# Patient Record
Sex: Female | Born: 1940 | Race: White | Hispanic: No | Marital: Married | Smoking: Former smoker
Health system: Southern US, Community
[De-identification: ages and names within clinical notes are randomized; demographics above are authoritative.]

## PROBLEM LIST (undated history)

## (undated) DIAGNOSIS — M1990 Unspecified osteoarthritis, unspecified site: Secondary | ICD-10-CM

## (undated) DIAGNOSIS — Z87440 Personal history of urinary (tract) infections: Secondary | ICD-10-CM

## (undated) DIAGNOSIS — K649 Unspecified hemorrhoids: Secondary | ICD-10-CM

## (undated) DIAGNOSIS — Z85828 Personal history of other malignant neoplasm of skin: Secondary | ICD-10-CM

## (undated) DIAGNOSIS — Z8619 Personal history of other infectious and parasitic diseases: Secondary | ICD-10-CM

## (undated) HISTORY — PX: URETHRAL STRICTURE DILATATION: SHX477

## (undated) HISTORY — PX: TUBAL LIGATION: SHX77

## (undated) HISTORY — PX: EYE SURGERY: SHX253

## (undated) HISTORY — PX: TONSILLECTOMY: SUR1361

---

## 1998-10-26 ENCOUNTER — Encounter: Admission: RE | Admit: 1998-10-26 | Discharge: 1998-10-26 | Payer: Self-pay | Admitting: Obstetrics and Gynecology

## 1999-11-02 ENCOUNTER — Encounter: Admission: RE | Admit: 1999-11-02 | Discharge: 1999-11-02 | Payer: Self-pay | Admitting: Obstetrics and Gynecology

## 1999-11-02 ENCOUNTER — Encounter: Payer: Self-pay | Admitting: Obstetrics and Gynecology

## 2000-11-01 ENCOUNTER — Encounter: Payer: Self-pay | Admitting: Obstetrics and Gynecology

## 2000-11-01 ENCOUNTER — Encounter: Admission: RE | Admit: 2000-11-01 | Discharge: 2000-11-01 | Payer: Self-pay | Admitting: Obstetrics and Gynecology

## 2001-11-12 ENCOUNTER — Encounter: Payer: Self-pay | Admitting: Obstetrics and Gynecology

## 2001-11-12 ENCOUNTER — Encounter: Admission: RE | Admit: 2001-11-12 | Discharge: 2001-11-12 | Payer: Self-pay | Admitting: Obstetrics and Gynecology

## 2002-11-19 ENCOUNTER — Encounter: Admission: RE | Admit: 2002-11-19 | Discharge: 2002-11-19 | Payer: Self-pay | Admitting: Obstetrics and Gynecology

## 2004-09-22 ENCOUNTER — Encounter: Admission: RE | Admit: 2004-09-22 | Discharge: 2004-09-22 | Payer: Self-pay | Admitting: Obstetrics and Gynecology

## 2005-10-17 ENCOUNTER — Encounter: Admission: RE | Admit: 2005-10-17 | Discharge: 2005-10-17 | Payer: Self-pay | Admitting: Obstetrics and Gynecology

## 2006-12-06 ENCOUNTER — Encounter: Admission: RE | Admit: 2006-12-06 | Discharge: 2006-12-06 | Payer: Self-pay | Admitting: Obstetrics and Gynecology

## 2007-12-10 ENCOUNTER — Encounter: Admission: RE | Admit: 2007-12-10 | Discharge: 2007-12-10 | Payer: Self-pay | Admitting: Obstetrics and Gynecology

## 2009-09-22 ENCOUNTER — Encounter: Admission: RE | Admit: 2009-09-22 | Discharge: 2009-09-22 | Payer: Self-pay | Admitting: Obstetrics and Gynecology

## 2011-02-22 DIAGNOSIS — D239 Other benign neoplasm of skin, unspecified: Secondary | ICD-10-CM | POA: Diagnosis not present

## 2011-02-22 DIAGNOSIS — C44519 Basal cell carcinoma of skin of other part of trunk: Secondary | ICD-10-CM | POA: Diagnosis not present

## 2011-02-22 DIAGNOSIS — L738 Other specified follicular disorders: Secondary | ICD-10-CM | POA: Diagnosis not present

## 2011-04-28 DIAGNOSIS — H35439 Paving stone degeneration of retina, unspecified eye: Secondary | ICD-10-CM | POA: Diagnosis not present

## 2011-04-28 DIAGNOSIS — H43819 Vitreous degeneration, unspecified eye: Secondary | ICD-10-CM | POA: Diagnosis not present

## 2011-04-28 DIAGNOSIS — Z961 Presence of intraocular lens: Secondary | ICD-10-CM | POA: Diagnosis not present

## 2011-04-28 DIAGNOSIS — H01009 Unspecified blepharitis unspecified eye, unspecified eyelid: Secondary | ICD-10-CM | POA: Diagnosis not present

## 2012-04-30 DIAGNOSIS — H52209 Unspecified astigmatism, unspecified eye: Secondary | ICD-10-CM | POA: Diagnosis not present

## 2012-04-30 DIAGNOSIS — Z961 Presence of intraocular lens: Secondary | ICD-10-CM | POA: Diagnosis not present

## 2012-04-30 DIAGNOSIS — H04129 Dry eye syndrome of unspecified lacrimal gland: Secondary | ICD-10-CM | POA: Diagnosis not present

## 2012-04-30 DIAGNOSIS — H43819 Vitreous degeneration, unspecified eye: Secondary | ICD-10-CM | POA: Diagnosis not present

## 2012-11-20 DIAGNOSIS — L821 Other seborrheic keratosis: Secondary | ICD-10-CM | POA: Diagnosis not present

## 2012-11-20 DIAGNOSIS — L570 Actinic keratosis: Secondary | ICD-10-CM | POA: Diagnosis not present

## 2012-11-20 DIAGNOSIS — D485 Neoplasm of uncertain behavior of skin: Secondary | ICD-10-CM | POA: Diagnosis not present

## 2012-11-20 DIAGNOSIS — L739 Follicular disorder, unspecified: Secondary | ICD-10-CM | POA: Diagnosis not present

## 2012-11-20 DIAGNOSIS — D235 Other benign neoplasm of skin of trunk: Secondary | ICD-10-CM | POA: Diagnosis not present

## 2012-11-20 DIAGNOSIS — Z85828 Personal history of other malignant neoplasm of skin: Secondary | ICD-10-CM | POA: Diagnosis not present

## 2013-02-06 ENCOUNTER — Other Ambulatory Visit: Payer: Self-pay

## 2013-02-06 DIAGNOSIS — Z1231 Encounter for screening mammogram for malignant neoplasm of breast: Secondary | ICD-10-CM

## 2013-02-25 ENCOUNTER — Ambulatory Visit: Payer: Self-pay

## 2013-02-26 ENCOUNTER — Ambulatory Visit
Admission: RE | Admit: 2013-02-26 | Discharge: 2013-02-26 | Disposition: A | Discharge status: Home / Self Care | Payer: Medicare Other | Source: Ambulatory Visit

## 2013-02-26 DIAGNOSIS — Z1231 Encounter for screening mammogram for malignant neoplasm of breast: Secondary | ICD-10-CM

## 2013-05-06 DIAGNOSIS — Z780 Asymptomatic menopausal state: Secondary | ICD-10-CM | POA: Diagnosis not present

## 2013-05-06 DIAGNOSIS — Z23 Encounter for immunization: Secondary | ICD-10-CM | POA: Diagnosis not present

## 2013-05-06 DIAGNOSIS — Z136 Encounter for screening for cardiovascular disorders: Secondary | ICD-10-CM | POA: Diagnosis not present

## 2013-05-06 DIAGNOSIS — Z0000 Encounter for general adult medical examination without abnormal findings: Secondary | ICD-10-CM | POA: Diagnosis not present

## 2013-05-07 ENCOUNTER — Other Ambulatory Visit: Payer: Self-pay | Admitting: Family Medicine

## 2013-05-07 DIAGNOSIS — E2839 Other primary ovarian failure: Secondary | ICD-10-CM

## 2013-05-13 DIAGNOSIS — H52209 Unspecified astigmatism, unspecified eye: Secondary | ICD-10-CM | POA: Diagnosis not present

## 2013-05-13 DIAGNOSIS — H524 Presbyopia: Secondary | ICD-10-CM | POA: Diagnosis not present

## 2013-05-13 DIAGNOSIS — H43819 Vitreous degeneration, unspecified eye: Secondary | ICD-10-CM | POA: Diagnosis not present

## 2013-05-13 DIAGNOSIS — Z961 Presence of intraocular lens: Secondary | ICD-10-CM | POA: Diagnosis not present

## 2013-05-14 ENCOUNTER — Ambulatory Visit
Admission: RE | Admit: 2013-05-14 | Discharge: 2013-05-14 | Disposition: A | Discharge status: Home / Self Care | Payer: Medicare Other | Source: Ambulatory Visit | Attending: Family Medicine | Admitting: Family Medicine

## 2013-05-14 DIAGNOSIS — M949 Disorder of cartilage, unspecified: Secondary | ICD-10-CM | POA: Diagnosis not present

## 2013-05-14 DIAGNOSIS — M899 Disorder of bone, unspecified: Secondary | ICD-10-CM | POA: Diagnosis not present

## 2013-05-14 DIAGNOSIS — E2839 Other primary ovarian failure: Secondary | ICD-10-CM

## 2013-05-16 DIAGNOSIS — Z1211 Encounter for screening for malignant neoplasm of colon: Secondary | ICD-10-CM | POA: Diagnosis not present

## 2013-07-11 DIAGNOSIS — M21069 Valgus deformity, not elsewhere classified, unspecified knee: Secondary | ICD-10-CM | POA: Diagnosis not present

## 2013-07-11 DIAGNOSIS — M1710 Unilateral primary osteoarthritis, unspecified knee: Secondary | ICD-10-CM | POA: Diagnosis not present

## 2013-07-16 DIAGNOSIS — R300 Dysuria: Secondary | ICD-10-CM | POA: Diagnosis not present

## 2013-10-10 DIAGNOSIS — IMO0002 Reserved for concepts with insufficient information to code with codable children: Secondary | ICD-10-CM | POA: Diagnosis not present

## 2013-10-10 DIAGNOSIS — M1710 Unilateral primary osteoarthritis, unspecified knee: Secondary | ICD-10-CM | POA: Diagnosis not present

## 2013-10-22 DIAGNOSIS — Z01810 Encounter for preprocedural cardiovascular examination: Secondary | ICD-10-CM | POA: Diagnosis not present

## 2013-10-22 DIAGNOSIS — M179 Osteoarthritis of knee, unspecified: Secondary | ICD-10-CM | POA: Diagnosis not present

## 2013-10-22 DIAGNOSIS — Z23 Encounter for immunization: Secondary | ICD-10-CM | POA: Diagnosis not present

## 2013-10-31 ENCOUNTER — Other Ambulatory Visit: Payer: Self-pay | Admitting: Orthopedic Surgery

## 2013-10-31 NOTE — Progress Notes (Signed)
Preoperative surgical orders have been place into the Epic hospital system for Jamie Galloway on 10/31/2013, 12:58 PM  by Mickel Crow for surgery on 11/18/2013.  Preop Total Knee orders including Experal, IV Tylenol, and IV Decadron as long as there are no contraindications to the above medications. Arlee Muslim, PA-C

## 2013-11-06 ENCOUNTER — Encounter (HOSPITAL_COMMUNITY): Payer: Self-pay | Admitting: Pharmacy Technician

## 2013-11-07 ENCOUNTER — Encounter (HOSPITAL_COMMUNITY)
Admission: RE | Admit: 2013-11-07 | Discharge: 2013-11-07 | Disposition: A | Discharge status: Home / Self Care | Payer: Medicare Other | Source: Ambulatory Visit | Attending: Orthopedic Surgery | Admitting: Orthopedic Surgery

## 2013-11-07 ENCOUNTER — Ambulatory Visit (HOSPITAL_COMMUNITY)
Admission: RE | Admit: 2013-11-07 | Discharge: 2013-11-07 | Disposition: A | Discharge status: Home / Self Care | Payer: Medicare Other | Source: Ambulatory Visit | Attending: Anesthesiology | Admitting: Anesthesiology

## 2013-11-07 ENCOUNTER — Encounter (HOSPITAL_COMMUNITY): Payer: Self-pay

## 2013-11-07 DIAGNOSIS — Z01818 Encounter for other preprocedural examination: Secondary | ICD-10-CM | POA: Diagnosis not present

## 2013-11-07 DIAGNOSIS — M179 Osteoarthritis of knee, unspecified: Secondary | ICD-10-CM | POA: Insufficient documentation

## 2013-11-07 DIAGNOSIS — R05 Cough: Secondary | ICD-10-CM | POA: Insufficient documentation

## 2013-11-07 DIAGNOSIS — R059 Cough, unspecified: Secondary | ICD-10-CM

## 2013-11-07 HISTORY — DX: Unspecified hemorrhoids: K64.9

## 2013-11-07 HISTORY — DX: Personal history of other infectious and parasitic diseases: Z86.19

## 2013-11-07 HISTORY — DX: Unspecified osteoarthritis, unspecified site: M19.90

## 2013-11-07 LAB — COMPREHENSIVE METABOLIC PANEL
ALBUMIN: 3.8 g/dL (ref 3.5–5.2)
ALT: 23 U/L (ref 0–35)
ANION GAP: 10 (ref 5–15)
AST: 27 U/L (ref 0–37)
Alkaline Phosphatase: 64 U/L (ref 39–117)
BUN: 18 mg/dL (ref 6–23)
CALCIUM: 9.4 mg/dL (ref 8.4–10.5)
CO2: 26 mEq/L (ref 19–32)
Chloride: 100 mEq/L (ref 96–112)
Creatinine, Ser: 0.72 mg/dL (ref 0.50–1.10)
GFR calc Af Amer: >90 mL/min (ref >90)
GFR calc non Af Amer: 83 mL/min — ABNORMAL LOW (ref >90)
GLUCOSE: 104 mg/dL — AB (ref 70–99)
POTASSIUM: 5 meq/L (ref 3.7–5.3)
SODIUM: 136 meq/L — AB (ref 137–147)
TOTAL PROTEIN: 6.5 g/dL (ref 6.0–8.3)
Total Bilirubin: 1.5 mg/dL — ABNORMAL HIGH (ref 0.3–1.2)

## 2013-11-07 LAB — URINALYSIS, ROUTINE W REFLEX MICROSCOPIC
Bilirubin Urine: NEGATIVE
Glucose, UA: NEGATIVE mg/dL
Hgb urine dipstick: NEGATIVE
KETONES UR: NEGATIVE mg/dL
LEUKOCYTES UA: NEGATIVE
Nitrite: NEGATIVE
PH: 6 (ref 5.0–8.0)
Protein, ur: NEGATIVE mg/dL
Specific Gravity, Urine: 1.017 (ref 1.005–1.030)
Urobilinogen, UA: 0.2 mg/dL (ref 0.0–1.0)

## 2013-11-07 LAB — CBC
HCT: 42.5 % (ref 36.0–46.0)
HEMOGLOBIN: 15.1 g/dL — AB (ref 12.0–15.0)
MCH: 31.6 pg (ref 26.0–34.0)
MCHC: 35.5 g/dL (ref 30.0–36.0)
MCV: 88.9 fL (ref 78.0–100.0)
Platelets: 283 10*3/uL (ref 150–400)
RBC: 4.78 MIL/uL (ref 3.87–5.11)
RDW: 12.7 % (ref 11.5–15.5)
WBC: 8.1 10*3/uL (ref 4.0–10.5)

## 2013-11-07 LAB — SURGICAL PCR SCREEN
MRSA, PCR: NEGATIVE
STAPHYLOCOCCUS AUREUS: NEGATIVE

## 2013-11-07 LAB — PROTIME-INR
INR: 1.06 (ref 0.00–1.49)
Prothrombin Time: 13.9 seconds (ref 11.6–15.2)

## 2013-11-07 LAB — APTT: APTT: 26 s (ref 24–37)

## 2013-11-07 NOTE — Patient Instructions (Signed)
YOUR SURGERY IS SCHEDULED AT [facility]  ON:  Monday  11/2  REPORT TO  SHORT STAY CENTER AT:  6:30 AM   DO NOT EAT OR DRINK ANYTHING AFTER MIDNIGHT THE NIGHT BEFORE YOUR SURGERY.  YOU MAY BRUSH YOUR TEETH, RINSE OUT YOUR MOUTH--BUT NO WATER, NO FOOD, NO CHEWING GUM, NO MINTS, NO CANDIES, NO CHEWING TOBACCO.  PLEASE TAKE THE FOLLOWING MEDICATIONS THE AM OF YOUR SURGERY WITH A FEW SIPS OF WATER:  NO MEDICATIONS TO TAKE    DO NOT BRING VALUABLES, MONEY, CREDIT CARDS.  DO NOT WEAR JEWELRY, MAKE-UP, NAIL POLISH AND NO METAL PINS OR CLIPS IN YOUR HAIR. CONTACT LENS, DENTURES / PARTIALS, GLASSES SHOULD NOT BE WORN TO SURGERY AND IN MOST CASES-HEARING AIDS WILL NEED TO BE REMOVED.  BRING YOUR GLASSES CASE, ANY EQUIPMENT NEEDED FOR YOUR CONTACT LENS. FOR PATIENTS ADMITTED TO THE HOSPITAL--CHECK OUT TIME THE DAY OF DISCHARGE IS 11:00 AM.  ALL INPATIENT ROOMS ARE PRIVATE - WITH BATHROOM, TELEPHONE, TELEVISION AND WIFI INTERNET.    PLEASE BE AWARE THAT YOU MAY NEED ADDITIONAL BLOOD DRAWN DAY OF YOUR SURGERY  _______________________________________________________________________   [facility] - Preparing for Surgery Before surgery, you can play an important role.  Because skin is not sterile, your skin needs to be as free of germs as possible.  You can reduce the number of germs on your skin by washing with CHG (chlorahexidine gluconate) soap before surgery.  CHG is an antiseptic cleaner which kills germs and bonds with the skin to continue killing germs even after washing. Please DO NOT use if you have an allergy to CHG or antibacterial soaps.  If your skin becomes reddened/irritated stop using the CHG and inform your nurse when you arrive at Short Stay. Do not shave (including legs and underarms) for at least 48 hours prior to the first CHG shower.  You may shave your face/neck. Please follow these instructions carefully:  1.  Shower with CHG Soap the night before surgery and the   morning of Surgery.  2.  If you choose to wash your hair, wash your hair first as usual with your  normal  shampoo.  3.  After you shampoo, rinse your hair and body thoroughly to remove the  shampoo.                           4.  Use CHG as you would any other liquid soap.  You can apply chg directly  to the skin and wash                       Gently with a scrungie or clean washcloth.  5.  Apply the CHG Soap to your body ONLY FROM THE NECK DOWN.   Do not use on face/ open                           Wound or open sores. Avoid contact with eyes, ears mouth and genitals (private parts).                       Wash face,  Genitals (private parts) with your normal soap.             6.  Wash thoroughly, paying special attention to the area where your surgery  will be performed.  7.  Thoroughly rinse your body with  warm water from the neck down.  8.  DO NOT shower/wash with your normal soap after using and rinsing off  the CHG Soap.                9.  Pat yourself dry with a clean towel.            10.  Wear clean pajamas.            11.  Place clean sheets on your bed the night of your first shower and do not  sleep with pets. Day of Surgery : Do not apply any lotions/deodorants the morning of surgery.  Please wear clean clothes to the hospital/surgery center.  FAILURE TO FOLLOW THESE INSTRUCTIONS MAY RESULT IN THE CANCELLATION OF YOUR SURGERY PATIENT SIGNATURE_________________________________  NURSE SIGNATURE__________________________________  ________________________________________________________________________   Jamie Galloway  An incentive spirometer is a tool that can help keep your lungs clear and active. This tool measures how well you are filling your lungs with each breath. Taking long deep breaths may help reverse or decrease the chance of developing breathing (pulmonary) problems (especially infection) following:  A long period of time when you are unable to move or be  active. BEFORE THE PROCEDURE   If the spirometer includes an indicator to show your best effort, your nurse or respiratory therapist will set it to a desired goal.  If possible, sit up straight or lean slightly forward. Try not to slouch.  Hold the incentive spirometer in an upright position. INSTRUCTIONS FOR USE  1. Sit on the edge of your bed if possible, or sit up as far as you can in bed or on a chair. 2. Hold the incentive spirometer in an upright position. 3. Breathe out normally. 4. Place the mouthpiece in your mouth and seal your lips tightly around it. 5. Breathe in slowly and as deeply as possible, raising the piston or the ball toward the top of the column. 6. Hold your breath for 3-5 seconds or for as long as possible. Allow the piston or ball to fall to the bottom of the column. 7. Remove the mouthpiece from your mouth and breathe out normally. 8. Rest for a few seconds and repeat Steps 1 through 7 at least 10 times every 1-2 hours when you are awake. Take your time and take a few normal breaths between deep breaths. 9. The spirometer may include an indicator to show your best effort. Use the indicator as a goal to work toward during each repetition. 10. After each set of 10 deep breaths, practice coughing to be sure your lungs are clear. If you have an incision (the cut made at the time of surgery), support your incision when coughing by placing a pillow or rolled up towels firmly against it. Once you are able to get out of bed, walk around indoors and cough well. You may stop using the incentive spirometer when instructed by your caregiver.  RISKS AND COMPLICATIONS  Take your time so you do not get dizzy or light-headed.  If you are in pain, you may need to take or ask for pain medication before doing incentive spirometry. It is harder to take a deep breath if you are having pain. AFTER USE  Rest and breathe slowly and easily.  It can be helpful to keep track of a log of  your progress. Your caregiver can provide you with a simple table to help with this. If you are using the spirometer at home, follow these  instructions: SEEK MEDICAL CARE IF:   You are having difficultly using the spirometer.  You have trouble using the spirometer as often as instructed.  Your pain medication is not giving enough relief while using the spirometer.  You develop fever of 100.5 F (38.1 C) or higher. SEEK IMMEDIATE MEDICAL CARE IF:   You cough up bloody sputum that had not been present before.  You develop fever of 102 F (38.9 C) or greater.  You develop worsening pain at or near the incision site. MAKE SURE YOU:   Understand these instructions.  Will watch your condition.  Will get help right away if you are not doing well or get worse. Document Released: 05/16/2006 Document Revised: 03/28/2011 Document Reviewed: 07/17/2006 Exit[employer] Patient Information 2014 [employer], [place].   ________________________________________________________________________  WHAT IS A BLOOD TRANSFUSION? Blood Transfusion Information  A transfusion is the replacement of blood or some of its parts. Blood is made up of multiple cells which provide different functions.  Red blood cells carry oxygen and are used for blood loss replacement.  White blood cells fight against infection.  Platelets control bleeding.  Plasma helps clot blood.  Other blood products are available for specialized needs, such as hemophilia or other clotting disorders. BEFORE THE TRANSFUSION  Who gives blood for transfusions?   Healthy volunteers who are fully evaluated to make sure their blood is safe. This is blood bank blood. Transfusion therapy is the safest it has ever been in the practice of medicine. Before blood is taken from a donor, a complete history is taken to make sure that person has no history of diseases nor engages in risky social behavior (examples are intravenous drug use or sexual activity  with multiple partners). The donor's travel history is screened to minimize risk of transmitting infections, such as malaria. The donated blood is tested for signs of infectious diseases, such as HIV and hepatitis. The blood is then tested to be sure it is compatible with you in order to minimize the chance of a transfusion reaction. If you or a relative donates blood, this is often done in anticipation of surgery and is not appropriate for emergency situations. It takes many days to process the donated blood. RISKS AND COMPLICATIONS Although transfusion therapy is very safe and saves many lives, the main dangers of transfusion include:   Getting an infectious disease.  Developing a transfusion reaction. This is an allergic reaction to something in the blood you were given. Every precaution is taken to prevent this. The decision to have a blood transfusion has been considered carefully by your caregiver before blood is given. Blood is not given unless the benefits outweigh the risks. AFTER THE TRANSFUSION  Right after receiving a blood transfusion, you will usually feel much better and more energetic. This is especially true if your red blood cells have gotten low (anemic). The transfusion raises the level of the red blood cells which carry oxygen, and this usually causes an energy increase.  The nurse administering the transfusion will monitor you carefully for complications. HOME CARE INSTRUCTIONS  No special instructions are needed after a transfusion. You may find your energy is better. Speak with your caregiver about any limitations on activity for underlying diseases you may have. SEEK MEDICAL CARE IF:   Your condition is not improving after your transfusion.  You develop redness or irritation at the intravenous (IV) site. SEEK IMMEDIATE MEDICAL CARE IF:  Any of the following symptoms occur over the next 12 hours:  Shaking  chills.  You have a temperature by mouth above 102 F (38.9  C), not controlled by medicine.  Chest, back, or muscle pain.  People around you feel you are not acting correctly or are confused.  Shortness of breath or difficulty breathing.  Dizziness and fainting.  You get a rash or develop hives.  You have a decrease in urine output.  Your urine turns a dark color or changes to pink, red, or brown. Any of the following symptoms occur over the next 10 days:  You have a temperature by mouth above 102 F (38.9 C), not controlled by medicine.  Shortness of breath.  Weakness after normal activity.  The white part of the eye turns yellow (jaundice).  You have a decrease in the amount of urine or are urinating less often.  Your urine turns a dark color or changes to pink, red, or brown. Document Released: 01/01/2000 Document Revised: 03/28/2011 Document Reviewed: 08/20/2007 [facility] Patient Information 2014 [place], [place].  _______________________________________________________________________

## 2013-11-07 NOTE — Pre-Procedure Instructions (Signed)
EKG REPORT ON PT'S CHART FROM 10/22/13 DR. RANKINS AND NOTE OF MEDICAL CLEARANCE FOR SURGERY. CXR WAS DONE TODAY PREOP AT [facility].

## 2013-11-17 ENCOUNTER — Ambulatory Visit: Payer: Self-pay | Admitting: Orthopedic Surgery

## 2013-11-17 NOTE — H&P (Signed)
Jamie Galloway DOB: 05/15/1940 Married / Language: English / Race: White Female Date of Admission:  11-18-2013 Chief Complaint:  Right knee pain History of Present Illness  The patient is a 73 year old female who comes in for a preoperative History and Physical. The patient is scheduled for a right total knee arthroplasty to be performed by Dr. Dione Plover. Aluisio, MD at [facility] on 11/18/2013. The patient is a 73 year old female who presents with knee complaints. The patient reports right knee symptoms including: pain which began year(s) ago without any known injury. The patient describes the severity of the symptoms as moderate in severity. The patient describes their pain as dull.The patient feels that the symptoms are unchanging. and include knee pain, swelling, stiffness, decreased range of motion and difficulty bearing weight. Symptoms are exacerbated by motion at the knee and weight bearing. Current treatment includes nonsteroidal anti-inflammatory drugs. Jamie Galloway comes in from referral from friends to have her right knee evaluated. The knee has been bothering her for many years and has gotten progressively worse with time. She states that "I turn my knee in" which has made it more difficult to stand for any length of time. Shes denies any numbness or tingling in that leg but gets some numbness in the opposite foot if stands too long and compensates of the right knee. She denies a lot of popping and catching and it has only buckled once or twice in the past. Her biggerst complaints or stiffness and pain. She played tennis for about 35 years but stopped about 8 years ago. She denies any specific injury to the leg. She has lost motion with the knee stating its more difficult to do her foot care. Its not bad first thing in the morning whenshe gets up but starts to hurt after a few steps. She sleeps okay by elevating both knees on pillows for support. She has tried creams with no  benefit, braces seem to make if hurt worse, and arch supports dont work. She will take oral Advil with some benefit. We have discussed injections but at this point, she wants to go ahead and get a more permanent fix to the knee. She is ready to proceed with the knee replacement. They have been treated conservatively in the past for the above stated problem and despite conservative measures, they continue to have progressive pain and severe functional limitations and dysfunction. They have failed non-operative management including home exercise, medications. It is felt that they would benefit from undergoing total joint replacement. Risks and benefits of the procedure have been discussed with the patient and they elect to proceed with surgery. There are no active contraindications to surgery such as ongoing infection or rapidly progressive neurological disease.   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 Osteoarthritis Mother, Paternal Grandmother. Osteoporosis Mother.  Social History Not under pain contract Number of flights of stairs before winded 2-3 No history of drug/alcohol rehab Living situation live with spouse Marital status married Tobacco / smoke exposure 07/05/2013: no Tobacco use Former smoker. 07/05/2013: smoke(d) less than 1/2 pack(s) per day Exercise Exercises daily; does running / walking and other Current drinker 07/05/2013: Currently drinks wine 5-7 times per week Current work status retired Children 2 Post-Surgical Plans Home with her sister  as a caregiver Advance Directives Living Will  Medication History  Tumeric Active. Fish Oil Active. Vitamins/Minerals (Oral)  Active. Advil (Oral) Specific dose unknown - Active.   Past Surgical History  Tubal Ligation Cataract Surgery bilateral Tonsillectomy   Review of Systems General Not Present- Chills, Fatigue, Fever, Memory Loss, Night Sweats, Weight Gain and Weight Loss. Skin Not Present- Eczema, Hives, Itching, Lesions and Rash. HEENT Not Present- Dentures, Double Vision, Headache, Hearing Loss, Tinnitus and Visual Loss. Respiratory Not Present- Allergies, Chronic Cough, Coughing up blood, Shortness of breath at rest and Shortness of breath with exertion. Cardiovascular Not Present- Chest Pain, Difficulty Breathing Lying Down, Murmur, Palpitations, Racing/skipping heartbeats and Swelling. Gastrointestinal Not Present- Abdominal Pain, Bloody Stool, Constipation, Diarrhea, Difficulty Swallowing, Heartburn, Jaundice, Loss of appetitie, Nausea and Vomiting. Female Genitourinary Not Present- Blood in Urine, Discharge, Flank Pain, Incontinence, Painful Urination, Urgency, Urinary frequency, Urinary Retention, Urinating at Night and Weak urinary stream. Musculoskeletal Present- Joint Pain. Not Present- Back Pain, Joint Swelling, Morning Stiffness, Muscle Pain, Muscle Weakness and Spasms. Neurological Not Present- Blackout spells, Difficulty with balance, Dizziness, Paralysis, Tremor and Weakness. Psychiatric Not Present- Insomnia.  Vitals  Pulse: 72 (Regular)  Resp.: 14 (Unlabored)  BP: 118/88 (Sitting, Left Arm, Standard)   Physical Exam General Mental Status -Alert, cooperative and good historian. General Appearance-pleasant, Not in acute distress. Orientation-Oriented X3. Build & Nutrition-Well nourished and Well developed.  Head and Neck Head-normocephalic, atraumatic . Neck Global Assessment - supple, no bruit auscultated on the right, no bruit auscultated on the left.  Eye Pupil - Bilateral-Regular and Round. Motion - Bilateral-EOMI.  Chest and Lung  Exam Auscultation Breath sounds - clear at anterior chest wall and clear at posterior chest wall. Adventitious sounds - No Adventitious sounds.  Cardiovascular Auscultation Rhythm - Regular rate and rhythm. Heart Sounds - S1 WNL and S2 WNL. Murmurs & Other Heart Sounds - Auscultation of the heart reveals - No Murmurs.  Abdomen Palpation/Percussion Tenderness - Abdomen is non-tender to palpation. Rigidity (guarding) - Abdomen is soft. Auscultation Auscultation of the abdomen reveals - Bowel sounds normal.  Female Genitourinary Note: Not done, not pertinent to present illness   Musculoskeletal Note: The physical exam findings are as follows:   Peripheral Vascular Lower Extremity: Palpation:Pulses- Right- pulses intact.   Neurologic Sensation:Lower Extremity- Right- sensation is intact to light touch in the lower extremity.   Musculoskeletal Lower Extremity Right Lower Extremity: Right Hip: ROM:- Full ROM of the hip. Right Knee: Inspection and Palpation:Tenderness- lateral joint line tender to palpation. no tenderness to palpation of the medial joint line. Effusion- none. Crepitus- moderate and severe. Pulses- 2+. Sensation- normal. Other characteristics- no ecchymosis and no erythema. Alignment:Tibiofemoral alignment- valgus alignment (about a 17 degree Valgus malalingment deformity noted). ROM: Flexion:AROM- 11 . PROM- 120 . Extension:AROM- 5 . PROM- 5 . Tender medial and lateral with slight varus and valgus laxity.  RADIOGRAPHS: AP of both knees and lateral show bone on bone arthritis, lateral and patellofemoral compartments of the right knee with significant medial narrowing also. The left knee is unremarkable.   Assessment & Plan  Osteoarthritis of right knee (M17.9) Note:Plan is for a Right Total Knee Replacement by Dr. Wynelle Link.  Plan is to go home with her sister.  PCP - Dr. Milagros Evener - Patient has been seen  preoperatively and felt to be stable for surgery.  The patient does not have any contraindications and will receive TXA (tranexamic acid) prior to surgery.  Signed electronically by Joelene Millin, III  PA-C

## 2013-11-18 ENCOUNTER — Inpatient Hospital Stay (HOSPITAL_COMMUNITY): Payer: Medicare Other | Admitting: Anesthesiology

## 2013-11-18 ENCOUNTER — Encounter (HOSPITAL_COMMUNITY): Payer: Self-pay | Admitting: *Deleted

## 2013-11-18 ENCOUNTER — Inpatient Hospital Stay (HOSPITAL_COMMUNITY)
Admission: RE | Admit: 2013-11-18 | Discharge: 2013-11-20 | DRG: 470 | Disposition: A | Discharge status: Home Health | Payer: Medicare Other | Source: Ambulatory Visit | Attending: Orthopedic Surgery | Admitting: Orthopedic Surgery

## 2013-11-18 ENCOUNTER — Encounter (HOSPITAL_COMMUNITY)
Admission: RE | Disposition: A | Discharge status: Home Health | Payer: Self-pay | Source: Ambulatory Visit | Attending: Orthopedic Surgery

## 2013-11-18 DIAGNOSIS — M1711 Unilateral primary osteoarthritis, right knee: Principal | ICD-10-CM | POA: Diagnosis present

## 2013-11-18 DIAGNOSIS — Z6830 Body mass index (BMI) 30.0-30.9, adult: Secondary | ICD-10-CM

## 2013-11-18 DIAGNOSIS — M1710 Unilateral primary osteoarthritis, unspecified knee: Secondary | ICD-10-CM | POA: Diagnosis present

## 2013-11-18 DIAGNOSIS — M25561 Pain in right knee: Secondary | ICD-10-CM | POA: Diagnosis not present

## 2013-11-18 DIAGNOSIS — M179 Osteoarthritis of knee, unspecified: Secondary | ICD-10-CM | POA: Diagnosis not present

## 2013-11-18 HISTORY — PX: TOTAL KNEE ARTHROPLASTY: SHX125

## 2013-11-18 LAB — ABO/RH: ABO/RH(D): A POS

## 2013-11-18 LAB — TYPE AND SCREEN
ABO/RH(D): A POS
Antibody Screen: NEGATIVE

## 2013-11-18 SURGERY — ARTHROPLASTY, KNEE, TOTAL
Anesthesia: Monitor Anesthesia Care | Site: Knee | Laterality: Right

## 2013-11-18 MED ORDER — BUPIVACAINE HCL 0.25 % IJ SOLN
INTRAMUSCULAR | Status: DC | PRN
  Administered 2013-11-18: 20 mL

## 2013-11-18 MED ORDER — BUPIVACAINE LIPOSOME 1.3 % IJ SUSP
INTRAMUSCULAR | Status: DC | PRN
  Administered 2013-11-18: 20 mL

## 2013-11-18 MED ORDER — MIDAZOLAM HCL 2 MG/2ML IJ SOLN
INTRAMUSCULAR | Status: AC
  Filled 2013-11-18: qty 2

## 2013-11-18 MED ORDER — BISACODYL 10 MG RE SUPP: 10.0000 mg | Freq: Every day | RECTAL | Status: DC | PRN

## 2013-11-18 MED ORDER — ONDANSETRON HCL 4 MG PO TABS: 4.0000 mg | ORAL_TABLET | Freq: Four times a day (QID) | ORAL | Status: DC | PRN

## 2013-11-18 MED ORDER — ONDANSETRON HCL 4 MG/2ML IJ SOLN
INTRAMUSCULAR | Status: DC | PRN
  Administered 2013-11-18: 4 mg via INTRAVENOUS

## 2013-11-18 MED ORDER — ACETAMINOPHEN 325 MG PO TABS: 650.0000 mg | ORAL_TABLET | Freq: Four times a day (QID) | ORAL | Status: DC | PRN

## 2013-11-18 MED ORDER — PROPOFOL INFUSION 10 MG/ML OPTIME
INTRAVENOUS | Status: DC | PRN
  Administered 2013-11-18: 75 ug/kg/min via INTRAVENOUS

## 2013-11-18 MED ORDER — DEXAMETHASONE SODIUM PHOSPHATE 10 MG/ML IJ SOLN
10.0000 mg | Freq: Once | INTRAMUSCULAR | Status: AC
  Administered 2013-11-18: 10 mg via INTRAVENOUS

## 2013-11-18 MED ORDER — ONDANSETRON HCL 4 MG/2ML IJ SOLN: 4.0000 mg | Freq: Four times a day (QID) | INTRAMUSCULAR | Status: DC | PRN

## 2013-11-18 MED ORDER — DEXAMETHASONE SODIUM PHOSPHATE 10 MG/ML IJ SOLN
INTRAMUSCULAR | Status: AC
  Filled 2013-11-18: qty 1

## 2013-11-18 MED ORDER — ACETAMINOPHEN 10 MG/ML IV SOLN
1000.0000 mg | Freq: Once | INTRAVENOUS | Status: AC
  Administered 2013-11-18: 1000 mg via INTRAVENOUS
  Filled 2013-11-18: qty 100

## 2013-11-18 MED ORDER — CEFAZOLIN SODIUM-DEXTROSE 2-3 GM-% IV SOLR
2.0000 g | INTRAVENOUS | Status: AC
  Administered 2013-11-18: 2 g via INTRAVENOUS

## 2013-11-18 MED ORDER — ACETAMINOPHEN 500 MG PO TABS
1000.0000 mg | ORAL_TABLET | Freq: Four times a day (QID) | ORAL | Status: AC
  Administered 2013-11-18 – 2013-11-19 (×3): 1000 mg via ORAL
  Filled 2013-11-18 (×3): qty 2

## 2013-11-18 MED ORDER — PROPOFOL 10 MG/ML IV BOLUS
INTRAVENOUS | Status: AC
  Filled 2013-11-18: qty 20

## 2013-11-18 MED ORDER — METOCLOPRAMIDE HCL 10 MG PO TABS: 5.0000 mg | ORAL_TABLET | Freq: Three times a day (TID) | ORAL | Status: DC | PRN

## 2013-11-18 MED ORDER — BUPIVACAINE HCL (PF) 0.75 % IJ SOLN
INTRAMUSCULAR | Status: DC | PRN
  Administered 2013-11-18: 12 mg via INTRATHECAL

## 2013-11-18 MED ORDER — DOCUSATE SODIUM 100 MG PO CAPS
100.0000 mg | ORAL_CAPSULE | Freq: Two times a day (BID) | ORAL | Status: DC
  Administered 2013-11-18 – 2013-11-20 (×4): 100 mg via ORAL

## 2013-11-18 MED ORDER — DEXAMETHASONE SODIUM PHOSPHATE 10 MG/ML IJ SOLN
10.0000 mg | Freq: Once | INTRAMUSCULAR | Status: AC
  Administered 2013-11-19: 10 mg via INTRAVENOUS
  Filled 2013-11-18: qty 1

## 2013-11-18 MED ORDER — MORPHINE SULFATE 2 MG/ML IJ SOLN
1.0000 mg | INTRAMUSCULAR | Status: DC | PRN
  Administered 2013-11-18: 2 mg via INTRAVENOUS
  Filled 2013-11-18: qty 1

## 2013-11-18 MED ORDER — METHOCARBAMOL 500 MG PO TABS
500.0000 mg | ORAL_TABLET | Freq: Four times a day (QID) | ORAL | Status: DC | PRN
  Administered 2013-11-18 – 2013-11-19 (×3): 500 mg via ORAL
  Filled 2013-11-18 (×3): qty 1

## 2013-11-18 MED ORDER — KETOROLAC TROMETHAMINE 15 MG/ML IJ SOLN: 7.5000 mg | Freq: Four times a day (QID) | INTRAMUSCULAR | Status: AC | PRN

## 2013-11-18 MED ORDER — BUPIVACAINE HCL (PF) 0.25 % IJ SOLN
INTRAMUSCULAR | Status: AC
  Filled 2013-11-18: qty 30

## 2013-11-18 MED ORDER — SODIUM CHLORIDE 0.9 % IJ SOLN
INTRAMUSCULAR | Status: DC | PRN
  Administered 2013-11-18: 30 mL

## 2013-11-18 MED ORDER — TRANEXAMIC ACID 100 MG/ML IV SOLN
1000.0000 mg | INTRAVENOUS | Status: AC
  Administered 2013-11-18: 1000 mg via INTRAVENOUS
  Filled 2013-11-18: qty 10

## 2013-11-18 MED ORDER — OXYCODONE HCL 5 MG PO TABS
5.0000 mg | ORAL_TABLET | ORAL | Status: DC | PRN
  Administered 2013-11-18 – 2013-11-20 (×8): 10 mg via ORAL
  Filled 2013-11-18 (×9): qty 2

## 2013-11-18 MED ORDER — MENTHOL 3 MG MT LOZG: 1.0000 | LOZENGE | OROMUCOSAL | Status: DC | PRN

## 2013-11-18 MED ORDER — FENTANYL CITRATE 0.05 MG/ML IJ SOLN
INTRAMUSCULAR | Status: AC
  Filled 2013-11-18: qty 2

## 2013-11-18 MED ORDER — ACETAMINOPHEN 650 MG RE SUPP: 650.0000 mg | Freq: Four times a day (QID) | RECTAL | Status: DC | PRN

## 2013-11-18 MED ORDER — BUPIVACAINE LIPOSOME 1.3 % IJ SUSP
20.0000 mL | Freq: Once | INTRAMUSCULAR | Status: DC
  Filled 2013-11-18: qty 20

## 2013-11-18 MED ORDER — HYDROMORPHONE HCL 1 MG/ML IJ SOLN: 0.2500 mg | INTRAMUSCULAR | Status: DC | PRN

## 2013-11-18 MED ORDER — POLYETHYLENE GLYCOL 3350 17 G PO PACK: 17.0000 g | PACK | Freq: Every day | ORAL | Status: DC | PRN

## 2013-11-18 MED ORDER — MIDAZOLAM HCL 5 MG/5ML IJ SOLN
INTRAMUSCULAR | Status: DC | PRN
  Administered 2013-11-18 (×2): 1 mg via INTRAVENOUS

## 2013-11-18 MED ORDER — FENTANYL CITRATE 0.05 MG/ML IJ SOLN
INTRAMUSCULAR | Status: DC | PRN
  Administered 2013-11-18 (×2): 50 ug via INTRAVENOUS

## 2013-11-18 MED ORDER — RIVAROXABAN 10 MG PO TABS
10.0000 mg | ORAL_TABLET | Freq: Every day | ORAL | Status: DC
  Administered 2013-11-19 – 2013-11-20 (×2): 10 mg via ORAL
  Filled 2013-11-18 (×3): qty 1

## 2013-11-18 MED ORDER — SODIUM CHLORIDE 0.9 % IV SOLN
INTRAVENOUS | Status: DC
  Administered 2013-11-18: 15:00:00 via INTRAVENOUS

## 2013-11-18 MED ORDER — CEFAZOLIN SODIUM-DEXTROSE 2-3 GM-% IV SOLR
INTRAVENOUS | Status: AC
Start: 2013-11-18 — End: 2013-11-18
  Filled 2013-11-18: qty 50

## 2013-11-18 MED ORDER — METHOCARBAMOL 1000 MG/10ML IJ SOLN
500.0000 mg | Freq: Four times a day (QID) | INTRAVENOUS | Status: DC | PRN
  Filled 2013-11-18 (×2): qty 5

## 2013-11-18 MED ORDER — PHENOL 1.4 % MT LIQD
1.0000 | OROMUCOSAL | Status: DC | PRN
Start: 2013-11-18 — End: 2013-11-20

## 2013-11-18 MED ORDER — SODIUM CHLORIDE 0.9 % IJ SOLN
INTRAMUSCULAR | Status: AC
  Filled 2013-11-18: qty 50

## 2013-11-18 MED ORDER — CHLORHEXIDINE GLUCONATE 4 % EX LIQD: 60.0000 mL | Freq: Once | CUTANEOUS | Status: DC

## 2013-11-18 MED ORDER — SODIUM CHLORIDE 0.9 % IV SOLN: INTRAVENOUS | Status: DC

## 2013-11-18 MED ORDER — CEFAZOLIN SODIUM-DEXTROSE 2-3 GM-% IV SOLR
2.0000 g | Freq: Four times a day (QID) | INTRAVENOUS | Status: AC
  Administered 2013-11-18 (×2): 2 g via INTRAVENOUS
  Filled 2013-11-18 (×2): qty 50

## 2013-11-18 MED ORDER — LACTATED RINGERS IV SOLN: INTRAVENOUS | Status: DC

## 2013-11-18 MED ORDER — LACTATED RINGERS IV SOLN
INTRAVENOUS | Status: DC | PRN
  Administered 2013-11-18 (×2): via INTRAVENOUS

## 2013-11-18 MED ORDER — METOCLOPRAMIDE HCL 5 MG/ML IJ SOLN: 5.0000 mg | Freq: Three times a day (TID) | INTRAMUSCULAR | Status: DC | PRN

## 2013-11-18 MED ORDER — FLEET ENEMA 7-19 GM/118ML RE ENEM: 1.0000 | ENEMA | Freq: Once | RECTAL | Status: AC | PRN

## 2013-11-18 MED ORDER — ONDANSETRON HCL 4 MG/2ML IJ SOLN
INTRAMUSCULAR | Status: AC
  Filled 2013-11-18: qty 2

## 2013-11-18 MED ORDER — DIPHENHYDRAMINE HCL 12.5 MG/5ML PO ELIX: 12.5000 mg | ORAL_SOLUTION | ORAL | Status: DC | PRN

## 2013-11-18 MED ORDER — TRAMADOL HCL 50 MG PO TABS
50.0000 mg | ORAL_TABLET | Freq: Four times a day (QID) | ORAL | Status: DC | PRN
Start: 2013-11-18 — End: 2013-11-20

## 2013-11-18 SURGICAL SUPPLY — 58 items
BAG ZIPLOCK 12X15 (MISCELLANEOUS) ×2 IMPLANT
BANDAGE ELASTIC 6 VELCRO ST LF (GAUZE/BANDAGES/DRESSINGS) ×2 IMPLANT
BANDAGE ESMARK 6X9 LF (GAUZE/BANDAGES/DRESSINGS) ×1 IMPLANT
BLADE SAG 18X100X1.27 (BLADE) ×2 IMPLANT
BLADE SAW SGTL 11.0X1.19X90.0M (BLADE) ×2 IMPLANT
BNDG ESMARK 6X9 LF (GAUZE/BANDAGES/DRESSINGS) ×2
BOWL SMART MIX CTS (DISPOSABLE) ×2 IMPLANT
CAPT RP KNEE ×2 IMPLANT
CEMENT HV SMART SET (Cement) ×4 IMPLANT
CUFF TOURN SGL QUICK 34 (TOURNIQUET CUFF) ×1
CUFF TRNQT CYL 34X4X40X1 (TOURNIQUET CUFF) ×1 IMPLANT
DECANTER SPIKE VIAL GLASS SM (MISCELLANEOUS) ×2 IMPLANT
DRAPE EXTREMITY TIBURON (DRAPES) ×2 IMPLANT
DRAPE POUCH INSTRU U-SHP 10X18 (DRAPES) ×2 IMPLANT
DRAPE U-SHAPE 47X51 STRL (DRAPES) ×2 IMPLANT
DRSG ADAPTIC 3X8 NADH LF (GAUZE/BANDAGES/DRESSINGS) ×2 IMPLANT
DRSG PAD ABDOMINAL 8X10 ST (GAUZE/BANDAGES/DRESSINGS) IMPLANT
DURAPREP 26ML APPLICATOR (WOUND CARE) ×2 IMPLANT
ELECT REM PT RETURN 9FT ADLT (ELECTROSURGICAL) ×2
ELECTRODE REM PT RTRN 9FT ADLT (ELECTROSURGICAL) ×1 IMPLANT
EVACUATOR 1/8 PVC DRAIN (DRAIN) ×2 IMPLANT
FACESHIELD WRAPAROUND (MASK) ×10 IMPLANT
GAUZE SPONGE 4X4 12PLY STRL (GAUZE/BANDAGES/DRESSINGS) ×2 IMPLANT
GLOVE BIO SURGEON STRL SZ7.5 (GLOVE) IMPLANT
GLOVE BIO SURGEON STRL SZ8 (GLOVE) ×2 IMPLANT
GLOVE BIOGEL PI IND STRL 6.5 (GLOVE) IMPLANT
GLOVE BIOGEL PI IND STRL 8 (GLOVE) ×1 IMPLANT
GLOVE BIOGEL PI INDICATOR 6.5 (GLOVE)
GLOVE BIOGEL PI INDICATOR 8 (GLOVE) ×1
GLOVE SURG SS PI 6.5 STRL IVOR (GLOVE) IMPLANT
GOWN STRL REUS W/TWL LRG LVL3 (GOWN DISPOSABLE) ×2 IMPLANT
GOWN STRL REUS W/TWL XL LVL3 (GOWN DISPOSABLE) IMPLANT
HANDPIECE INTERPULSE COAX TIP (DISPOSABLE) ×1
IMMOBILIZER KNEE 20 (SOFTGOODS) ×2 IMPLANT
IMMOBILIZER KNEE 20 THIGH 36 (SOFTGOODS) IMPLANT
KIT BASIN OR (CUSTOM PROCEDURE TRAY) ×2 IMPLANT
MANIFOLD NEPTUNE II (INSTRUMENTS) ×2 IMPLANT
NDL SAFETY ECLIPSE 18X1.5 (NEEDLE) ×2 IMPLANT
NEEDLE HYPO 18GX1.5 SHARP (NEEDLE) ×2
NS IRRIG 1000ML POUR BTL (IV SOLUTION) ×2 IMPLANT
PACK TOTAL JOINT (CUSTOM PROCEDURE TRAY) ×2 IMPLANT
PAD ABD 8X10 STRL (GAUZE/BANDAGES/DRESSINGS) ×2 IMPLANT
PADDING CAST COTTON 6X4 STRL (CAST SUPPLIES) ×2 IMPLANT
POSITIONER SURGICAL ARM (MISCELLANEOUS) ×2 IMPLANT
SET HNDPC FAN SPRY TIP SCT (DISPOSABLE) ×1 IMPLANT
STRIP CLOSURE SKIN 1/2X4 (GAUZE/BANDAGES/DRESSINGS) ×2 IMPLANT
SUCTION FRAZIER 12FR DISP (SUCTIONS) ×2 IMPLANT
SUT MNCRL AB 4-0 PS2 18 (SUTURE) ×2 IMPLANT
SUT VIC AB 2-0 CT1 27 (SUTURE) ×3
SUT VIC AB 2-0 CT1 TAPERPNT 27 (SUTURE) ×3 IMPLANT
SUT VLOC 180 0 24IN GS25 (SUTURE) ×2 IMPLANT
SYR 20CC LL (SYRINGE) ×2 IMPLANT
SYR 50ML LL SCALE MARK (SYRINGE) ×2 IMPLANT
TOWEL OR 17X26 10 PK STRL BLUE (TOWEL DISPOSABLE) ×2 IMPLANT
TOWEL OR NON WOVEN STRL DISP B (DISPOSABLE) IMPLANT
TRAY FOLEY CATH 14FRSI W/METER (CATHETERS) ×2 IMPLANT
WATER STERILE IRR 1500ML POUR (IV SOLUTION) ×2 IMPLANT
WRAP KNEE MAXI GEL POST OP (GAUZE/BANDAGES/DRESSINGS) ×2 IMPLANT

## 2013-11-18 NOTE — Anesthesia Postprocedure Evaluation (Signed)
  Anesthesia Post-op Note  Patient: Jamie Galloway  Procedure(s) Performed: Procedure(s): TOTAL RIGHT KNEE ARTHROPLASTY (Right)  Patient Location: PACU  Anesthesia Type: Spinal/MAC  Level of Consciousness: awake and alert   Airway and Oxygen Therapy: Patient Spontanous Breathing  Post-op Pain: none  Post-op Assessment: Post-op Vital signs reviewed, Patient's Cardiovascular Status Stable and Respiratory Function Stable. No residual motor block.  Post-op Vital Signs: Reviewed  Filed Vitals:   11/18/13 1331  BP: 145/74  Pulse: 68  Temp: 36.4 C  Resp: 14    Complications: No apparent anesthesia complications

## 2013-11-18 NOTE — Evaluation (Signed)
Physical Therapy Evaluation Patient Details Name: Jamie Galloway MRN: [id number] DOB: [birth year] Today's Date: 11/18/2013   History of Present Illness  RTKA  Clinical Impression  Patient tolerated short walk today. Patient will benefit from PT to address problems listed in noted below.    Follow Up Recommendations Home health PT;Supervision/Assistance - 24 hour    Equipment Recommendations  None recommended by PT    Recommendations for Other Services       Precautions / Restrictions Precautions Precautions: Knee Required Braces or Orthoses: Knee Immobilizer - Right Knee Immobilizer - Right: Discontinue once straight leg raise with < 10 degree lag      Mobility  Bed Mobility Overal bed mobility: Needs Assistance Bed Mobility: Supine to Sit     Supine to sit: Min assist     General bed mobility comments: support R knee  Transfers Overall transfer level: Needs assistance Equipment used: Rolling walker (2 wheeled) Transfers: Sit to/from Stand Sit to Stand: Min assist;From elevated surface         General transfer comment: cues for technique.  Ambulation/Gait Ambulation/Gait assistance: Min assist Ambulation Distance (Feet): 10 Feet Assistive device: Rolling walker (2 wheeled) Gait Pattern/deviations: Step-to pattern;Decreased step length - right;Decreased stance time - right;Antalgic     General Gait Details: cues for sequence  Stairs            Wheelchair Mobility    Modified Rankin (Stroke Patients Only)       Balance                                             Pertinent Vitals/Pain Pain Assessment: 0-10 Pain Score: 4  Pain Location: R knee Pain Descriptors / Indicators: Aching Pain Intervention(s): Premedicated before session    Home Living Family/patient expects to be discharged to:: Private residence Living Arrangements: Spouse/significant other;Children;Other relatives Available Help at Discharge: Family Type of  Home: House Home Access: Ramped entrance     Home Layout: One level Home Equipment: [place] - 2 wheels;Cane - single point      Prior Function Level of Independence: Independent with assistive device(s)               Hand Dominance        Extremity/Trunk Assessment               Lower Extremity Assessment: RLE deficits/detail RLE Deficits / Details: able to perform SLR       Communication   Communication: No difficulties  Cognition Arousal/Alertness: Awake/alert Behavior During Therapy: WFL for tasks assessed/performed Overall Cognitive Status: Within Functional Limits for tasks assessed                      General Comments      Exercises        Assessment/Plan    PT Assessment Patient needs continued PT services  PT Diagnosis Difficulty walking;Acute pain   PT Problem List Decreased strength;Decreased range of motion;Decreased activity tolerance;Decreased mobility;Decreased knowledge of precautions;Decreased safety awareness;Decreased knowledge of use of DME;Pain  PT Treatment Interventions DME instruction;Gait training;Functional mobility training;Therapeutic activities;Therapeutic exercise;Patient/family education   PT Goals (Current goals can be found in the Care Plan section) Acute Rehab PT Goals Patient Stated Goal: to  walk in the park without pain, PT Goal Formulation: With patient/family Time For Goal Achievement: 11/23/13    Frequency 7X/week  Barriers to discharge        Co-evaluation               End of Session Equipment Utilized During Treatment: Right knee immobilizer Activity Tolerance: Patient tolerated treatment well Patient left: in chair;with call bell/phone within reach;with family/visitor present Nurse Communication: Mobility status         Time: 3838-1840 PT Time Calculation (min): 17 min   Charges:     PT Treatments $Gait Training: 8-22 mins   PT G Codes:          Claretha Cooper 11/18/2013, 6:29 PM

## 2013-11-18 NOTE — Progress Notes (Signed)
Utilization review completed.  

## 2013-11-18 NOTE — Interval H&P Note (Signed)
History and Physical Interval Note:  11/18/2013 7:02 AM  Jamie Galloway  has presented today for surgery, with the diagnosis of OA RIGHT KNEE  The various methods of treatment have been discussed with the patient and family. After consideration of risks, benefits and other options for treatment, the patient has consented to  Procedure(s): TOTAL RIGHT KNEE ARTHROPLASTY (Right) as a surgical intervention .  The patient's history has been reviewed, patient examined, no change in status, stable for surgery.  I have reviewed the patient's chart and labs.  Questions were answered to the patient's satisfaction.     Gearlean Alf

## 2013-11-18 NOTE — Anesthesia Procedure Notes (Addendum)
Spinal Patient location during procedure: OR Start time: 11/18/2013 9:13 AM End time: 11/18/2013 9:23 AM Staffing Anesthesiologist: Roderic Palau E Performed by: anesthesiologist  Preanesthetic Checklist Completed: patient identified, surgical consent, pre-op evaluation, timeout performed, IV checked, risks and benefits discussed and monitors and equipment checked Spinal Block Patient position: sitting Prep: Betadine Patient monitoring: cardiac monitor, continuous pulse ox and blood pressure Approach: midline Location: L3-4 Injection technique: single-shot Needle Needle type: Spinocan  Needle gauge: 25 G Needle length: 9 cm Assessment Sensory level: T10 Additional Notes Functioning IV was confirmed and monitors were applied. Sterile prep and drape, including hand hygiene and sterile gloves were used. The patient was positioned and the spine was prepped. The skin was anesthetized with lidocaine.  Free flow of clear CSF was obtained prior to injecting local anesthetic into the CSF.  The spinal needle aspirated freely following injection.  The needle was carefully withdrawn.  The patient tolerated the procedure well.

## 2013-11-18 NOTE — Transfer of Care (Signed)
Immediate Anesthesia Transfer of Care Note  Patient: Jamie Galloway  Procedure(s) Performed: Procedure(s): TOTAL RIGHT KNEE ARTHROPLASTY (Right)  Patient Location: PACU  Anesthesia Type:Spinal  Level of Consciousness: awake, alert , oriented and patient cooperative  Airway & Oxygen Therapy: Patient Spontanous Breathing and Patient connected to face mask oxygen  Post-op Assessment: Report given to PACU RN and Post -op Vital signs reviewed and stable  Post vital signs: Reviewed and stable  Complications: No apparent anesthesia complications

## 2013-11-18 NOTE — Anesthesia Preprocedure Evaluation (Addendum)
Anesthesia Evaluation  Patient identified by MRN, date of birth, ID band Patient awake    Reviewed: Allergy & Precautions, H&P , NPO status , Patient's Chart, lab work & pertinent test results  Airway Mallampati: II  TM Distance: >3 FB Neck ROM: Full    Dental no notable dental hx. (+) Teeth Intact, Dental Advisory Given   Pulmonary neg pulmonary ROS, former smoker,  breath sounds clear to auscultation  Pulmonary exam normal       Cardiovascular negative cardio ROS  Rhythm:Regular Rate:Normal     Neuro/Psych negative neurological ROS  negative psych ROS   GI/Hepatic negative GI ROS, Neg liver ROS,   Endo/Other  negative endocrine ROS  Renal/GU negative Renal ROS  negative genitourinary   Musculoskeletal  (+) Arthritis -, Osteoarthritis,    Abdominal   Peds  Hematology negative hematology ROS (+)   Anesthesia Other Findings   Reproductive/Obstetrics negative OB ROS                           Anesthesia Physical Anesthesia Plan  ASA: II  Anesthesia Plan: MAC and Spinal   Post-op Pain Management:    Induction: Intravenous  Airway Management Planned: Simple Face Mask  Additional Equipment:   Intra-op Plan:   Post-operative Plan:   Informed Consent: I have reviewed the patients History and Physical, chart, labs and discussed the procedure including the risks, benefits and alternatives for the proposed anesthesia with the patient or authorized representative who has indicated his/her understanding and acceptance.   Dental advisory given  Plan Discussed with: CRNA  Anesthesia Plan Comments:         Anesthesia Quick Evaluation

## 2013-11-18 NOTE — Op Note (Signed)
Pre-operative diagnosis- Osteoarthritis  Right knee(s)  Post-operative diagnosis- Osteoarthritis Right knee(s)  Procedure-  Right  Total Knee Arthroplasty  Surgeon- Dione Plover. Toshie Demelo, MD  Assistant- Ardeen Jourdain, PA-C   Anesthesia-  Spinal  EBL-* No blood loss amount entered *   Drains Hemovac  Tourniquet time-  Total Tourniquet Time Documented: Thigh (Right) - 28 minutes Total: Thigh (Right) - 28 minutes     Complications- None  Condition-PACU - hemodynamically stable.   Brief Clinical Note  Jamie Galloway is a 73 y.o. year old female with end stage OA of her right knee with progressively worsening pain and dysfunction. She has constant pain, with activity and at rest and significant functional deficits with difficulties even with ADLs. She has had extensive non-op management including analgesics, injections of cortisone and viscosupplements, and home exercise program, but remains in significant pain with significant dysfunction.Radiographs show bone on bone arthritis lateral and patellofemoral. She presents now for right Total Knee Arthroplasty.    Procedure in detail---   The patient is brought into the operating room and positioned supine on the operating table. After successful administration of  Spinal,   a tourniquet is placed high on the  Right thigh(s) and the lower extremity is prepped and draped in the usual sterile fashion. Time out is performed by the operating team and then the  Right lower extremity is wrapped in Esmarch, knee flexed and the tourniquet inflated to 300 mmHg.       A midline incision is made with a ten blade through the subcutaneous tissue to the level of the extensor mechanism. A fresh blade is used to make a medial parapatellar arthrotomy. Soft tissue over the proximal medial tibia is subperiosteally elevated to the joint line with a knife and into the semimembranosus bursa with a Cobb elevator. Soft tissue over the proximal lateral tibia is elevated  with attention being paid to avoiding the patellar tendon on the tibial tubercle. The patella is everted, knee flexed 90 degrees and the ACL and PCL are removed. Findings are bone on bone lateral and patellofemoral with large global osteophytes.        The drill is used to create a starting hole in the distal femur and the canal is thoroughly irrigated with sterile saline to remove the fatty contents. The 5 degree Right  valgus alignment guide is placed into the femoral canal and the distal femoral cutting block is pinned to remove 10 mm off the distal femur. Resection is made with an oscillating saw.      The tibia is subluxed forward and the menisci are removed. The extramedullary alignment guide is placed referencing proximally at the medial aspect of the tibial tubercle and distally along the second metatarsal axis and tibial crest. The block is pinned to remove 45mm off the more deficient lateral  side. Resection is made with an oscillating saw. Size 3is the most appropriate size for the tibia and the proximal tibia is prepared with the modular drill and keel punch for that size.      The femoral sizing guide is placed and size 2.5 is most appropriate. Rotation is marked off the epicondylar axis and confirmed by creating a rectangular flexion gap at 90 degrees. The size 2.5 cutting block is pinned in this rotation and the anterior, posterior and chamfer cuts are made with the oscillating saw. The intercondylar block is then placed and that cut is made.      Trial size 3 tibial component, trial  size 2.5 posterior stabilized femur and a 10  mm posterior stabilized rotating platform insert trial is placed. Full extension is achieved with excellent varus/valgus and anterior/posterior balance throughout full range of motion. The patella is everted and thickness measured to be 22  mm. Free hand resection is taken to 12 mm, a 35 template is placed, lug holes are drilled, trial patella is placed, and it tracks  normally. Osteophytes are removed off the posterior femur with the trial in place. All trials are removed and the cut bone surfaces prepared with pulsatile lavage. Cement is mixed and once ready for implantation, the size 3 tibial implant, size  2.5 posterior stabilized femoral component, and the size 35 patella are cemented in place and the patella is held with the clamp. The trial insert is placed and the knee held in full extension. The Exparel (20 ml mixed with 30 ml saline) and .25% Bupivicaine, are injected into the extensor mechanism, posterior capsule, medial and lateral gutters and subcutaneous tissues.  All extruded cement is removed and once the cement is hard the permanent 10 mm posterior stabilized rotating platform insert is placed into the tibial tray.      The wound is copiously irrigated with saline solution and the extensor mechanism closed over a hemovac drain with #1 V-loc suture. The tourniquet is released for a total tourniquet time of 28  minutes. Flexion against gravity is 140 degrees and the patella tracks normally. Subcutaneous tissue is closed with 2.0 vicryl and subcuticular with running 4.0 Monocryl. The incision is cleaned and dried and steri-strips and a bulky sterile dressing are applied. The limb is placed into a knee immobilizer and the patient is awakened and transported to recovery in stable condition.      Please note that a surgical assistant was a medical necessity for this procedure in order to perform it in a safe and expeditious manner. Surgical assistant was necessary to retract the ligaments and vital neurovascular structures to prevent injury to them and also necessary for proper positioning of the limb to allow for anatomic placement of the prosthesis.   Dione Plover Mele Sylvester, MD    11/18/2013, 10:08 AM

## 2013-11-18 NOTE — H&P (View-Only) (Signed)
Jamie Galloway DOB: 1940/06/03 Married / Language: English / Race: White Female Date of Admission:  11-18-2013 Chief Complaint:  Right knee pain History of Present Illness  The patient is a 73 year old female who comes in for a preoperative History and Physical. The patient is scheduled for a right total knee arthroplasty to be performed by Dr. Dione Plover. Aluisio, MD at [facility] on 11/18/2013. The patient is a 73 year old female who presents with knee complaints. The patient reports right knee symptoms including: pain which began year(s) ago without any known injury. The patient describes the severity of the symptoms as moderate in severity. The patient describes their pain as dull.The patient feels that the symptoms are unchanging. and include knee pain, swelling, stiffness, decreased range of motion and difficulty bearing weight. Symptoms are exacerbated by motion at the knee and weight bearing. Current treatment includes nonsteroidal anti-inflammatory drugs. Jamie Galloway comes in from referral from friends to have her right knee evaluated. The knee has been bothering her for many years and has gotten progressively worse with time. She states that "I turn my knee in" which has made it more difficult to stand for any length of time. Shes denies any numbness or tingling in that leg but gets some numbness in the opposite foot if stands too long and compensates of the right knee. She denies a lot of popping and catching and it has only buckled once or twice in the past. Her biggerst complaints or stiffness and pain. She played tennis for about 35 years but stopped about 8 years ago. She denies any specific injury to the leg. She has lost motion with the knee stating its more difficult to do her foot care. Its not bad first thing in the morning whenshe gets up but starts to hurt after a few steps. She sleeps okay by elevating both knees on pillows for support. She has tried creams with no  benefit, braces seem to make if hurt worse, and arch supports dont work. She will take oral Advil with some benefit. We have discussed injections but at this point, she wants to go ahead and get a more permanent fix to the knee. She is ready to proceed with the knee replacement. They have been treated conservatively in the past for the above stated problem and despite conservative measures, they continue to have progressive pain and severe functional limitations and dysfunction. They have failed non-operative management including home exercise, medications. It is felt that they would benefit from undergoing total joint replacement. Risks and benefits of the procedure have been discussed with the patient and they elect to proceed with surgery. There are no active contraindications to surgery such as ongoing infection or rapidly progressive neurological disease.   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 Osteoarthritis Mother, Paternal Grandmother. Osteoporosis Mother.  Social History Not under pain contract Number of flights of stairs before winded 2-3 No history of drug/alcohol rehab Living situation live with spouse Marital status married Tobacco / smoke exposure 07/05/2013: no Tobacco use Former smoker. 07/05/2013: smoke(d) less than 1/2 pack(s) per day Exercise Exercises daily; does running / walking and other Current drinker 07/05/2013: Currently drinks wine 5-7 times per week Current work status retired Children 2 Post-Surgical Plans Home with her sister  as a caregiver Advance Directives Living Will  Medication History  Tumeric Active. Fish Oil Active. Vitamins/Minerals (Oral)  Active. Advil (Oral) Specific dose unknown - Active.   Past Surgical History  Tubal Ligation Cataract Surgery bilateral Tonsillectomy   Review of Systems General Not Present- Chills, Fatigue, Fever, Memory Loss, Night Sweats, Weight Gain and Weight Loss. Skin Not Present- Eczema, Hives, Itching, Lesions and Rash. HEENT Not Present- Dentures, Double Vision, Headache, Hearing Loss, Tinnitus and Visual Loss. Respiratory Not Present- Allergies, Chronic Cough, Coughing up blood, Shortness of breath at rest and Shortness of breath with exertion. Cardiovascular Not Present- Chest Pain, Difficulty Breathing Lying Down, Murmur, Palpitations, Racing/skipping heartbeats and Swelling. Gastrointestinal Not Present- Abdominal Pain, Bloody Stool, Constipation, Diarrhea, Difficulty Swallowing, Heartburn, Jaundice, Loss of appetitie, Nausea and Vomiting. Female Genitourinary Not Present- Blood in Urine, Discharge, Flank Pain, Incontinence, Painful Urination, Urgency, Urinary frequency, Urinary Retention, Urinating at Night and Weak urinary stream. Musculoskeletal Present- Joint Pain. Not Present- Back Pain, Joint Swelling, Morning Stiffness, Muscle Pain, Muscle Weakness and Spasms. Neurological Not Present- Blackout spells, Difficulty with balance, Dizziness, Paralysis, Tremor and Weakness. Psychiatric Not Present- Insomnia.  Vitals  Pulse: 72 (Regular)  Resp.: 14 (Unlabored)  BP: 118/88 (Sitting, Left Arm, Standard)   Physical Exam General Mental Status -Alert, cooperative and good historian. General Appearance-pleasant, Not in acute distress. Orientation-Oriented X3. Build & Nutrition-Well nourished and Well developed.  Head and Neck Head-normocephalic, atraumatic . Neck Global Assessment - supple, no bruit auscultated on the right, no bruit auscultated on the left.  Eye Pupil - Bilateral-Regular and Round. Motion - Bilateral-EOMI.  Chest and Lung  Exam Auscultation Breath sounds - clear at anterior chest wall and clear at posterior chest wall. Adventitious sounds - No Adventitious sounds.  Cardiovascular Auscultation Rhythm - Regular rate and rhythm. Heart Sounds - S1 WNL and S2 WNL. Murmurs & Other Heart Sounds - Auscultation of the heart reveals - No Murmurs.  Abdomen Palpation/Percussion Tenderness - Abdomen is non-tender to palpation. Rigidity (guarding) - Abdomen is soft. Auscultation Auscultation of the abdomen reveals - Bowel sounds normal.  Female Genitourinary Note: Not done, not pertinent to present illness   Musculoskeletal Note: The physical exam findings are as follows:   Peripheral Vascular Lower Extremity: Palpation:Pulses- Right- pulses intact.   Neurologic Sensation:Lower Extremity- Right- sensation is intact to light touch in the lower extremity.   Musculoskeletal Lower Extremity Right Lower Extremity: Right Hip: ROM:- Full ROM of the hip. Right Knee: Inspection and Palpation:Tenderness- lateral joint line tender to palpation. no tenderness to palpation of the medial joint line. Effusion- none. Crepitus- moderate and severe. Pulses- 2+. Sensation- normal. Other characteristics- no ecchymosis and no erythema. Alignment:Tibiofemoral alignment- valgus alignment (about a 17 degree Valgus malalingment deformity noted). ROM: Flexion:AROM- 11 . PROM- 120 . Extension:AROM- 5 . PROM- 5 . Tender medial and lateral with slight varus and valgus laxity.  RADIOGRAPHS: AP of both knees and lateral show bone on bone arthritis, lateral and patellofemoral compartments of the right knee with significant medial narrowing also. The left knee is unremarkable.   Assessment & Plan  Osteoarthritis of right knee (M17.9) Note:Plan is for a Right Total Knee Replacement by Dr. Wynelle Link.  Plan is to go home with her sister.  PCP - Dr. Milagros Evener - Patient has been seen  preoperatively and felt to be stable for surgery.  The patient does not have any contraindications and will receive TXA (tranexamic acid) prior to surgery.  Signed electronically by Joelene Millin, III  PA-C

## 2013-11-19 ENCOUNTER — Encounter (HOSPITAL_COMMUNITY): Payer: Self-pay | Admitting: Orthopedic Surgery

## 2013-11-19 LAB — BASIC METABOLIC PANEL
ANION GAP: 9 (ref 5–15)
BUN: 13 mg/dL (ref 6–23)
CHLORIDE: 103 meq/L (ref 96–112)
CO2: 24 mEq/L (ref 19–32)
CREATININE: 0.73 mg/dL (ref 0.50–1.10)
Calcium: 8.4 mg/dL (ref 8.4–10.5)
GFR, EST NON AFRICAN AMERICAN: 82 mL/min — AB (ref >90)
Glucose, Bld: 120 mg/dL — ABNORMAL HIGH (ref 70–99)
Potassium: 4 mEq/L (ref 3.7–5.3)
Sodium: 136 mEq/L — ABNORMAL LOW (ref 137–147)

## 2013-11-19 LAB — CBC
HCT: 34.7 % — ABNORMAL LOW (ref 36.0–46.0)
Hemoglobin: 12.2 g/dL (ref 12.0–15.0)
MCH: 31.4 pg (ref 26.0–34.0)
MCHC: 35.2 g/dL (ref 30.0–36.0)
MCV: 89.2 fL (ref 78.0–100.0)
PLATELETS: 243 10*3/uL (ref 150–400)
RBC: 3.89 MIL/uL (ref 3.87–5.11)
RDW: 12.7 % (ref 11.5–15.5)
WBC: 11.8 10*3/uL — ABNORMAL HIGH (ref 4.0–10.5)

## 2013-11-19 MED ORDER — OXYCODONE HCL 5 MG PO TABS: 5.0000 mg | ORAL_TABLET | ORAL | Status: DC | PRN

## 2013-11-19 MED ORDER — RIVAROXABAN 10 MG PO TABS: 10.0000 mg | ORAL_TABLET | Freq: Every day | ORAL | Status: DC

## 2013-11-19 MED ORDER — METHOCARBAMOL 500 MG PO TABS: 500.0000 mg | ORAL_TABLET | Freq: Four times a day (QID) | ORAL | Status: DC | PRN

## 2013-11-19 MED ORDER — TRAMADOL HCL 50 MG PO TABS: 50.0000 mg | ORAL_TABLET | Freq: Four times a day (QID) | ORAL | Status: DC | PRN

## 2013-11-19 NOTE — Evaluation (Signed)
Occupational Therapy Evaluation Patient Details Name: Jamie Galloway MRN: [id number] DOB: 04/19/1940 Today's Date: 11/19/2013    History of Present Illness RTKA   Clinical Impression   This 73 year old female was admitted for the above surgery.  All education was completed and pt will have initial 24/7 assistance at home.  No further OT needs are identified at this time.      Follow Up Recommendations  No OT follow up    Equipment Recommendations  3 in 1 bedside comode    Recommendations for Other Services       Precautions / Restrictions Precautions Precautions: Knee Required Braces or Orthoses: Knee Immobilizer - Right Knee Immobilizer - Right: Discontinue once straight leg raise with < 10 degree lag Restrictions Weight Bearing Restrictions: No      Mobility Bed Mobility   Bed Mobility: Supine to Sit     Supine to sit: Min assist     General bed mobility comments: support R knee  Transfers   Equipment used: Rolling walker (2 wheeled) Transfers: Sit to/from Stand Sit to Stand: Min assist         General transfer comment: cues for UE/LE placement    Balance                                            ADL Overall ADL's : Needs assistance/impaired             Lower Body Bathing: Minimal assistance;Sit to/from stand;With adaptive equipment       Lower Body Dressing: Minimal assistance;Sit to/from stand;With adaptive equipment   Toilet Transfer: Minimal assistance;BSC;Stand-pivot   Toileting- Clothing Manipulation and Hygiene: Minimal assistance;Sit to/from stand         General ADL Comments: educated on tub readiness.  Pt will sponge bathe initially.  She has 2 reachers and long brush.  demonstrated sock aide, but she will have assistance at home     Vision                     Perception     Praxis      Pertinent Vitals/Pain Pain Score: 3  Pain Location: R knee Pain Descriptors / Indicators: Aching Pain  Intervention(s): Limited activity within patient's tolerance;Monitored during session;Premedicated before session;Repositioned (removed ice)     Hand Dominance     Extremity/Trunk Assessment Upper Extremity Assessment Upper Extremity Assessment: Overall WFL for tasks assessed           Communication Communication Communication: No difficulties   Cognition Arousal/Alertness: Awake/alert Behavior During Therapy: WFL for tasks assessed/performed Overall Cognitive Status: Within Functional Limits for tasks assessed                     General Comments       Exercises       Shoulder Instructions      Home Living Family/patient expects to be discharged to:: Private residence Living Arrangements: Spouse/significant other;Children;Other relatives                               Additional Comments: sister and daughter will stay with her.  She will use bathroom with standard commode and tub shower:  she is fine with sponge bathing initially      Prior Functioning/Environment Level of Independence: Independent  with assistive device(s)             OT Diagnosis: Generalized weakness   OT Problem List:     OT Treatment/Interventions:      OT Goals(Current goals can be found in the care plan section)    OT Frequency:     Barriers to D/C:            Co-evaluation              End of Session    Activity Tolerance: Patient tolerated treatment well Patient left: in chair;with call bell/phone within reach   Time: 0757-0833 OT Time Calculation (min): 36 min Charges:  OT General Charges $OT Visit: 1 Procedure OT Evaluation $Initial OT Evaluation Tier I: 1 Procedure OT Treatments $Self Care/Home Management : 23-37 mins G-Codes:    Anvika Gashi 2013/12/19, 8:49 AM Lesle Chris, OTR/L [phone] 12-19-13

## 2013-11-19 NOTE — Progress Notes (Signed)
   Subjective: 1 Day Post-Op Procedure(s) (LRB): TOTAL RIGHT KNEE ARTHROPLASTY (Right) Patient reports pain as mild.   Patient seen in rounds with Dr. Wynelle Link. Doing well this morning. Patient is well, but has had some minor complaints of pain in the knee, requiring pain medications We will start therapy today.  Plan is to go Home after hospital stay.  Objective: Vital signs in last 24 hours: Temp:  [97.4 F (36.3 C)-98.6 F (37 C)] 98.6 F (37 C) (11/03 0445) Pulse Rate:  [61-88] 64 (11/03 0445) Resp:  [12-19] 16 (11/03 0737) BP: (121-157)/(45-80) 131/56 mmHg (11/03 0445) SpO2:  [97 %-100 %] 98 % (11/03 0737)  Intake/Output from previous day:  Intake/Output Summary (Last 24 hours) at 11/19/13 0812 Last data filed at 11/19/13 0630  Gross per 24 hour  Intake   3190 ml  Output   3420 ml  Net   -230 ml     Labs:  Recent Labs  11/19/13 0430  HGB 12.2    Recent Labs  11/19/13 0430  WBC 11.8*  RBC 3.89  HCT 34.7*  PLT 243    Recent Labs  11/19/13 0430  NA 136*  K 4.0  CL 103  CO2 24  BUN 13  CREATININE 0.73  GLUCOSE 120*  CALCIUM 8.4   No results for input(s): LABPT, INR in the last 72 hours.  EXAM General - Patient is Alert, Appropriate and Oriented Extremity - Neurovascular intact Sensation intact distally Dorsiflexion/Plantar flexion intact Dressing - dressing C/D/I Motor Function - intact, moving foot and toes well on exam.  Hemovac pulled without difficulty.  Past Medical History  Diagnosis Date  . Hemorrhoids     JUST OCCAS BLOOD - NO PROBLEMS AT PRESENT  . History of shingles     ABOUT 2012 - NO RESIDUAL PROBLEMS  . Cough     DRY COUGH - / ?ALLERGY RELATED  . Arthritis     PAIN AND OA RIGHT KNEE    Assessment/Plan: 1 Day Post-Op Procedure(s) (LRB): TOTAL RIGHT KNEE ARTHROPLASTY (Right) Principal Problem:   OA (osteoarthritis) of knee  Estimated body mass index is 30.9 kg/(m^2) as calculated from the following:   Height as of  this encounter: 5\' 2"  (1.575 m).   Weight as of this encounter: 76.658 kg (169 lb). Advance diet Up with therapy Plan for discharge tomorrow Discharge home with home health  DVT Prophylaxis - Xarelto Weight-Bearing as tolerated to right leg D/C O2 and Pulse OX and try on Room Air  Arlee Muslim, PA-C Orthopaedic Surgery 11/19/2013, 8:12 AM

## 2013-11-19 NOTE — Care Management Note (Signed)
    Page 1 of 2   11/19/2013     8:38:00 AM CARE MANAGEMENT NOTE 11/19/2013  Patient:  Jamie Galloway, Jamie Galloway   Account Number:  [id number]  Date Initiated:  11/19/2013  Documentation initiated by:  [facility]  Subjective/Objective Assessment:   adm: TOTAL RIGHT KNEE ARTHROPLASTY (Right)     Action/Plan:   discharge planning   Anticipated DC Date:  11/20/2013   Anticipated DC Plan:  [place]  CM consult      [facility] Choice  HOME HEALTH   Choice offered to / List presented to:  C-1 Patient   DME arranged  3-N-1      DME agency  [employer]     [place] arranged  HH-2 PT      [place]   Status of service:   Medicare Important Message given?   (If response is "NO", the following Medicare IM given date fields will be blank) Date Medicare IM given:   Medicare IM given by:   Date Additional Medicare IM given:   Additional Medicare IM given by:    Discharge Disposition:  [place]  Per UR Regulation:    If discussed at Long Length of Stay Meetings, dates discussed:    Comments:  11/19/13 07:30 CM met with pt in room to confirm choice of home health agency.  Pt confirms [facility] Go will render HHPT. Pt has rolling walker and cane at home and [employer] will provide 3n1.  Address and contact information verified with pt. Referral called to [facility].  No other CM needs were communicated.  Mariane Masters, BSN, CM [phone].

## 2013-11-19 NOTE — Discharge Summary (Signed)
Physician Discharge Summary   Patient ID: Jamie Galloway MRN: [id number] DOB/AGE: 1940-03-16 73 y.o.  Admit date: 11/18/2013 Discharge date: 11/20/2013  Primary Diagnosis:  Osteoarthritis Right knee(s)  Admission Diagnoses:  Past Medical History  Diagnosis Date  . Hemorrhoids     JUST OCCAS BLOOD - NO PROBLEMS AT PRESENT  . History of shingles     ABOUT 2012 - NO RESIDUAL PROBLEMS  . Cough     DRY COUGH - / ?ALLERGY RELATED  . Arthritis     PAIN AND OA RIGHT KNEE   Discharge Diagnoses:   Principal Problem:   OA (osteoarthritis) of knee  Estimated body mass index is 30.9 kg/(m^2) as calculated from the following:   Height as of this encounter: 5' 2"  (1.575 m).   Weight as of this encounter: 76.658 kg (169 lb).  Procedure:  Procedure(s) (LRB): TOTAL RIGHT KNEE ARTHROPLASTY (Right)   Consults: None  HPI: Jamie Galloway is a 73 y.o. year old female with end stage OA of her right knee with progressively worsening pain and dysfunction. She has constant pain, with activity and at rest and significant functional deficits with difficulties even with ADLs. She has had extensive non-op management including analgesics, injections of cortisone and viscosupplements, and home exercise program, but remains in significant pain with significant dysfunction.Radiographs show bone on bone arthritis lateral and patellofemoral. She presents now for right Total Knee Arthroplasty.   Laboratory Data: Admission on 11/18/2013, Discharged on 11/20/2013  Component Date Value Ref Range Status  . ABO/RH(D) 11/18/2013 A POS   Final  . Antibody Screen 11/18/2013 NEG   Final  . Sample Expiration 11/18/2013 11/21/2013   Final  . ABO/RH(D) 11/18/2013 A POS   Final  . WBC 11/19/2013 11.8* 4.0 - 10.5 K/uL Final  . RBC 11/19/2013 3.89  3.87 - 5.11 MIL/uL Final  . Hemoglobin 11/19/2013 12.2  12.0 - 15.0 g/dL Final  . HCT 11/19/2013 34.7* 36.0 - 46.0 % Final  . MCV 11/19/2013 89.2  78.0 - 100.0 fL Final  . MCH  11/19/2013 31.4  26.0 - 34.0 pg Final  . MCHC 11/19/2013 35.2  30.0 - 36.0 g/dL Final  . RDW 11/19/2013 12.7  11.5 - 15.5 % Final  . Platelets 11/19/2013 243  150 - 400 K/uL Final  . Sodium 11/19/2013 136* 137 - 147 mEq/L Final  . Potassium 11/19/2013 4.0  3.7 - 5.3 mEq/L Final  . Chloride 11/19/2013 103  96 - 112 mEq/L Final  . CO2 11/19/2013 24  19 - 32 mEq/L Final  . Glucose, Bld 11/19/2013 120* 70 - 99 mg/dL Final  . BUN 11/19/2013 13  6 - 23 mg/dL Final  . Creatinine, Ser 11/19/2013 0.73  0.50 - 1.10 mg/dL Final  . Calcium 11/19/2013 8.4  8.4 - 10.5 mg/dL Final  . GFR calc non Af Amer 11/19/2013 82* >90 mL/min Final  . GFR calc Af Amer 11/19/2013 >90  >90 mL/min Final   Comment: (NOTE) The eGFR has been calculated using the CKD EPI equation. This calculation has not been validated in all clinical situations. eGFR's persistently <90 mL/min signify possible Chronic Kidney Disease.   . Anion gap 11/19/2013 9  5 - 15 Final  . WBC 11/20/2013 12.5* 4.0 - 10.5 K/uL Final  . RBC 11/20/2013 3.90  3.87 - 5.11 MIL/uL Final  . Hemoglobin 11/20/2013 12.0  12.0 - 15.0 g/dL Final  . HCT 11/20/2013 35.2* 36.0 - 46.0 % Final  . MCV 11/20/2013 90.3  78.0 -  100.0 fL Final  . MCH 11/20/2013 30.8  26.0 - 34.0 pg Final  . MCHC 11/20/2013 34.1  30.0 - 36.0 g/dL Final  . RDW 11/20/2013 13.1  11.5 - 15.5 % Final  . Platelets 11/20/2013 255  150 - 400 K/uL Final  . Sodium 11/20/2013 141  137 - 147 mEq/L Final  . Potassium 11/20/2013 3.9  3.7 - 5.3 mEq/L Final  . Chloride 11/20/2013 103  96 - 112 mEq/L Final  . CO2 11/20/2013 27  19 - 32 mEq/L Final  . Glucose, Bld 11/20/2013 102* 70 - 99 mg/dL Final  . BUN 11/20/2013 9  6 - 23 mg/dL Final  . Creatinine, Ser 11/20/2013 0.63  0.50 - 1.10 mg/dL Final  . Calcium 11/20/2013 9.1  8.4 - 10.5 mg/dL Final  . GFR calc non Af Amer 11/20/2013 86* >90 mL/min Final  . GFR calc Af Amer 11/20/2013 >90  >90 mL/min Final   Comment: (NOTE) The eGFR has been  calculated using the CKD EPI equation. This calculation has not been validated in all clinical situations. eGFR's persistently <90 mL/min signify possible Chronic Kidney Disease.   Georgiann Hahn gap 11/20/2013 11  5 - 15 Final  Hospital Outpatient Visit on 11/07/2013  Component Date Value Ref Range Status  . MRSA, PCR 11/07/2013 NEGATIVE  NEGATIVE Final  . Staphylococcus aureus 11/07/2013 NEGATIVE  NEGATIVE Final   Comment:                                 The Xpert SA Assay (FDA                          approved for NASAL specimens                          in patients over 54 years of age),                          is one component of                          a comprehensive surveillance                          program.  Test performance has                          been validated by [employer] for patients greater                          than or equal to 4 year old.                          It is not intended                          to diagnose infection nor to                          guide  or monitor treatment.  Marland Kitchen aPTT 11/07/2013 26  24 - 37 seconds Final  . WBC 11/07/2013 8.1  4.0 - 10.5 K/uL Final  . RBC 11/07/2013 4.78  3.87 - 5.11 MIL/uL Final  . Hemoglobin 11/07/2013 15.1* 12.0 - 15.0 g/dL Final  . HCT 11/07/2013 42.5  36.0 - 46.0 % Final  . MCV 11/07/2013 88.9  78.0 - 100.0 fL Final  . MCH 11/07/2013 31.6  26.0 - 34.0 pg Final  . MCHC 11/07/2013 35.5  30.0 - 36.0 g/dL Final  . RDW 11/07/2013 12.7  11.5 - 15.5 % Final  . Platelets 11/07/2013 283  150 - 400 K/uL Final  . Sodium 11/07/2013 136* 137 - 147 mEq/L Final  . Potassium 11/07/2013 5.0  3.7 - 5.3 mEq/L Final  . Chloride 11/07/2013 100  96 - 112 mEq/L Final  . CO2 11/07/2013 26  19 - 32 mEq/L Final  . Glucose, Bld 11/07/2013 104* 70 - 99 mg/dL Final  . BUN 11/07/2013 18  6 - 23 mg/dL Final  . Creatinine, Ser 11/07/2013 0.72  0.50 - 1.10 mg/dL Final  . Calcium 11/07/2013 9.4  8.4 - 10.5  mg/dL Final  . Total Protein 11/07/2013 6.5  6.0 - 8.3 g/dL Final  . Albumin 11/07/2013 3.8  3.5 - 5.2 g/dL Final  . AST 11/07/2013 27  0 - 37 U/L Final  . ALT 11/07/2013 23  0 - 35 U/L Final  . Alkaline Phosphatase 11/07/2013 64  39 - 117 U/L Final  . Total Bilirubin 11/07/2013 1.5* 0.3 - 1.2 mg/dL Final  . GFR calc non Af Amer 11/07/2013 83* >90 mL/min Final  . GFR calc Af Amer 11/07/2013 >90  >90 mL/min Final   Comment: (NOTE)                          The eGFR has been calculated using the CKD EPI equation.                          This calculation has not been validated in all clinical situations.                          eGFR's persistently <90 mL/min signify possible Chronic Kidney                          Disease.  . Anion gap 11/07/2013 10  5 - 15 Final  . Prothrombin Time 11/07/2013 13.9  11.6 - 15.2 seconds Final  . INR 11/07/2013 1.06  0.00 - 1.49 Final  . Color, Urine 11/07/2013 YELLOW  YELLOW Final  . APPearance 11/07/2013 CLEAR  CLEAR Final  . Specific Gravity, Urine 11/07/2013 1.017  1.005 - 1.030 Final  . pH 11/07/2013 6.0  5.0 - 8.0 Final  . Glucose, UA 11/07/2013 NEGATIVE  NEGATIVE mg/dL Final  . Hgb urine dipstick 11/07/2013 NEGATIVE  NEGATIVE Final  . Bilirubin Urine 11/07/2013 NEGATIVE  NEGATIVE Final  . Ketones, ur 11/07/2013 NEGATIVE  NEGATIVE mg/dL Final  . Protein, ur 11/07/2013 NEGATIVE  NEGATIVE mg/dL Final  . Urobilinogen, UA 11/07/2013 0.2  0.0 - 1.0 mg/dL Final  . Nitrite 11/07/2013 NEGATIVE  NEGATIVE Final  . Leukocytes, UA 11/07/2013 NEGATIVE  NEGATIVE Final   MICROSCOPIC NOT DONE ON URINES WITH NEGATIVE PROTEIN, BLOOD, LEUKOCYTES, NITRITE, OR GLUCOSE <1000 mg/dL.     X-Rays:Dg Chest 2  View  11/07/2013   CLINICAL DATA:  Cough.  Preop right total knee arthroplasty.  EXAM: CHEST  2 VIEW  COMPARISON:  None.  FINDINGS: Normal sized heart. Clear lungs. Minimal diffuse peribronchial thickening. Mild thoracic spine degenerative changes. Left shoulder  degenerative changes.  IMPRESSION: Minimal bronchitic changes.   Electronically Signed   By: Enrique Sack M.D.   On: 11/07/2013 15:51    EKG:No orders found for this or any previous visit.   Hospital Course: Jamie Galloway is a 74 y.o. who was admitted to [facility]. They were brought to the operating room on 11/18/2013 and underwent Procedure(s): TOTAL RIGHT KNEE ARTHROPLASTY.  Patient tolerated the procedure well and was later transferred to the recovery room and then to the orthopaedic floor for postoperative care.  They were given PO and IV analgesics for pain control following their surgery.  They were given 24 hours of postoperative antibiotics of  Anti-infectives    Start     Dose/Rate Route Frequency Ordered Stop   11/18/13 1500  ceFAZolin (ANCEF) IVPB 2 g/50 mL premix     2 g100 mL/hr over 30 Minutes Intravenous Every 6 hours 11/18/13 1239 11/18/13 2147   11/18/13 0619  ceFAZolin (ANCEF) IVPB 2 g/50 mL premix     2 g100 mL/hr over 30 Minutes Intravenous On call to O.R. 11/18/13 7342 11/18/13 0928     and started on DVT prophylaxis in the form of Xarelto.   PT and OT were ordered for total joint protocol.  Discharge planning consulted to help with postop disposition and equipment needs.  Patient had a decent night on the evening of surgery.  They started to get up OOB with therapy on day one. Hemovac drain was pulled without difficulty.  Continued to work with therapy into day two.  Dressing was changed on day two and the incision was healing well. Patient was seen in rounds and was ready to go home.  Discharge home with home health Diet - Regular diet Follow up - in 2 weeks Activity - WBAT Disposition - Home Condition Upon Discharge - Good D/C Meds - See DC Summary DVT Prophylaxis - Xarelto for a total of three weeks      Discharge Instructions    Call MD / Call 911    Complete by:  As directed   If you experience chest pain or shortness of breath, CALL 911 and be  transported to the hospital emergency room.  If you develope a fever above 101 F, pus (white drainage) or increased drainage or redness at the wound, or calf pain, call your surgeon's office.     Change dressing    Complete by:  As directed   Change dressing daily with sterile 4 x 4 inch gauze dressing and apply TED hose. Do not submerge the incision under water.     Constipation Prevention    Complete by:  As directed   Drink plenty of fluids.  Prune juice may be helpful.  You may use a stool softener, such as Colace (over the counter) 100 mg twice a day.  Use MiraLax (over the counter) for constipation as needed.     Diet - low sodium heart healthy    Complete by:  As directed      Discharge instructions    Complete by:  As directed   Pick up stool softner and laxative for home. Do not submerge incision under water. May shower. Continue to use ice for pain and  swelling from surgery. Take Xarelto for two and a half more weeks, then discontinue Xarelto. .Once the patient has completed the blood thinner regimen, then take a Baby 81 mg Aspirin daily for three more weeks.     Do not put a pillow under the knee. Place it under the heel.    Complete by:  As directed      Do not sit on low chairs, stoools or toilet seats, as it may be difficult to get up from low surfaces    Complete by:  As directed      Driving restrictions    Complete by:  As directed   No driving until released by the physician.     Increase activity slowly as tolerated    Complete by:  As directed      Lifting restrictions    Complete by:  As directed   No lifting until released by the physician.     Patient may shower    Complete by:  As directed   You may shower without a dressing once there is no drainage.  Do not wash over the wound.  If drainage remains, do not shower until drainage stops.     TED hose    Complete by:  As directed   Use stockings (TED hose) for 3 weeks on both leg(s).  You may remove them at  night for sleeping.     Weight bearing as tolerated    Complete by:  As directed             Medication List    STOP taking these medications        ADVIL 200 MG Caps  Generic drug:  Ibuprofen     Calcium-Magnesium 500-250 MG Tabs     minoxidil 2 % external solution  Commonly known as:  ROGAINE     multivitamin with minerals Tabs tablet     PROBIOTIC DAILY PO     RA KRILL OIL 500 MG Caps     Turmeric 500 MG Caps     Vitamin D 2000 UNITS tablet      TAKE these medications        methocarbamol 500 MG tablet  Commonly known as:  ROBAXIN  Take 1 tablet (500 mg total) by mouth every 6 (six) hours as needed for muscle spasms.     oxyCODONE 5 MG immediate release tablet  Commonly known as:  Oxy IR/ROXICODONE  Take 1-2 tablets (5-10 mg total) by mouth every 3 (three) hours as needed for moderate pain, severe pain or breakthrough pain.     rivaroxaban 10 MG Tabs tablet  Commonly known as:  XARELTO  - Take 1 tablet (10 mg total) by mouth daily with breakfast. Take Xarelto for two and a half more weeks, then discontinue Xarelto.  - Once the patient has completed the blood thinner regimen, then take a Baby 81 mg Aspirin daily for three more weeks.     sodium chloride 0.65 % Soln nasal spray  Commonly known as:  OCEAN  Place 1 spray into both nostrils as needed for congestion.     SYSTANE BALANCE OP  Apply 1-2 drops to eye daily as needed (dry eyes).     traMADol 50 MG tablet  Commonly known as:  ULTRAM  Take 1-2 tablets (50-100 mg total) by mouth every 6 (six) hours as needed (mild pain).       Follow-up Information    Follow up with [facility].  Why:  home health physical therapy   Contact information:   [address] [place] [address] [phone]       Follow up with Gearlean Alf, MD. Schedule an appointment as soon as possible for a visit on 12/03/2013.   Specialty:  Orthopedic Surgery   Why:  Call office at [phone] to setup  appointment   Contact information:   [address] [place] [address] [phone]       Signed: Arlee Muslim, PA-C Orthopaedic Surgery 11/21/2013, 10:05 AM

## 2013-11-19 NOTE — Progress Notes (Signed)
Physical Therapy Treatment Patient Details Name: Jamie Galloway MRN: [id number] DOB: [birth year] Today's Date: 11/19/2013    History of Present Illness RTKA    PT Comments    Patient progressing, ambulating without KI  Follow Up Recommendations  Home health PT;Supervision/Assistance - 24 hour     Equipment Recommendations  None recommended by PT    Recommendations for Other Services       Precautions / Restrictions Precautions Precautions: Knee Required Braces or Orthoses: Knee Immobilizer - Right Knee Immobilizer - Right: Discontinue once straight leg raise with < 10 degree lag    Mobility  Bed Mobility   Bed Mobility: Supine to Sit;Sit to Supine     Supine to sit: Supervision Sit to supine: Supervision   General bed mobility comments: using  leg lifter  Transfers   Equipment used: Rolling walker (2 wheeled) Transfers: Sit to/from Stand Sit to Stand: Supervision         General transfer comment: cues for UE/LE placement  Ambulation/Gait Ambulation/Gait assistance: Min guard Ambulation Distance (Feet): 80 Feet Assistive device: Rolling walker (2 wheeled) Gait Pattern/deviations: Step-to pattern;Antalgic     General Gait Details: cues for sequence   Stairs            Wheelchair Mobility    Modified Rankin (Stroke Patients Only)       Balance                                    Cognition Arousal/Alertness: Awake/alert                          Exercises Total Joint Exercises Ankle Circles/Pumps: AROM;Supine Quad Sets: AROM;Both;10 reps;Supine Short Arc Quad: AROM;Right;10 reps;Supine Heel Slides: AAROM;Right;10 reps;Supine Hip ABduction/ADduction: AAROM;Right;10 reps;Supine Straight Leg Raises: AAROM;Right;10 reps;Supine Long Arc Quad: AROM;Right;10 reps;Seated Goniometric ROM: 10-50    General Comments        Pertinent Vitals/Pain Pain Score: 3  Pain Location: R knee Pain Descriptors / Indicators:  Aching Pain Intervention(s): Premedicated before session;Repositioned    Home Living                      Prior Function            PT Goals (current goals can now be found in the care plan section) Progress towards PT goals: Progressing toward goals    Frequency  7X/week    PT Plan Current plan remains appropriate    Co-evaluation             End of Session Equipment Utilized During Treatment: Right knee immobilizer Activity Tolerance: Patient tolerated treatment well Patient left: in bed;with call bell/phone within reach;with family/visitor present     Time: 9480-1655 PT Time Calculation (min): 18 min  Charges:  $Gait Training: 8-22 mins $Therapeutic Exercise: 8-22 mins $Self Care/Home Management: 09-25-2022                    G Codes:      Claretha Cooper 11/19/2013, 3:24 PM

## 2013-11-19 NOTE — Discharge Instructions (Signed)
Information on my medicine - XARELTO® (Rivaroxaban) ° °This medication education was reviewed with me or my healthcare representative as part of my discharge preparation.  The pharmacist that spoke with me during my hospital stay was:  Karla Pavone Robert, RPH ° °Why was Xarelto® prescribed for you? °Xarelto® was prescribed for you to reduce the risk of blood clots forming after orthopedic surgery. The medical term for these abnormal blood clots is venous thromboembolism (VTE). ° °What do you need to know about xarelto® ? °Take your Xarelto® ONCE DAILY at the same time every day. °You may take it either with or without food. ° °If you have difficulty swallowing the tablet whole, you may crush it and mix in applesauce just prior to taking your dose. ° °Take Xarelto® exactly as prescribed by your doctor and DO NOT stop taking Xarelto® without talking to the doctor who prescribed the medication.  Stopping without other VTE prevention medication to take the place of Xarelto® may increase your risk of developing a clot. ° °After discharge, you should have regular check-up appointments with your healthcare provider that is prescribing your Xarelto®.   ° °What do you do if you miss a dose? °If you miss a dose, take it as soon as you remember on the same day then continue your regularly scheduled once daily regimen the next day. Do not take two doses of Xarelto® on the same day.  ° °Important Safety Information °A possible side effect of Xarelto® is bleeding. You should call your healthcare provider right away if you experience any of the following: °? Bleeding from an injury or your nose that does not stop. °? Unusual colored urine (red or dark brown) or unusual colored stools (red or black). °? Unusual bruising for unknown reasons. °? A serious fall or if you hit your head (even if there is no bleeding). ° °Some medicines may interact with Xarelto® and might increase your risk of bleeding while on Xarelto®. To help  avoid this, consult your healthcare provider or pharmacist prior to using any new prescription or non-prescription medications, including herbals, vitamins, non-steroidal anti-inflammatory drugs (NSAIDs) and supplements. ° °This website has more information on Xarelto®: www.xarelto.com. ° ° ° °

## 2013-11-19 NOTE — Plan of Care (Signed)
Problem: Phase I Progression Outcomes Goal: Pain controlled with appropriate interventions Outcome: Progressing Goal: Dangle or out of bed evening of surgery Outcome: Progressing Goal: Initial discharge plan identified Outcome: Progressing Goal: Hemodynamically stable Outcome: Completed/Met Date Met:  11/19/13  Problem: Phase II Progression Outcomes Goal: Tolerating diet Outcome: Completed/Met Date Met:  11/19/13

## 2013-11-19 NOTE — Progress Notes (Signed)
Physical Therapy Treatment Patient Details Name: Jamie Galloway MRN: [id number] DOB: 01/13/1941 Today's Date: 11/19/2013    History of Present Illness RTKA    PT Comments    Patient improving, likes the leg lifter.   Follow Up Recommendations  Home health PT;Supervision/Assistance - 24 hour     Equipment Recommendations  None recommended by PT    Recommendations for Other Services       Precautions / Restrictions Precautions Precautions: Knee Required Braces or Orthoses: Knee Immobilizer - Right Knee Immobilizer - Right: Discontinue once straight leg raise with < 10 degree lag    Mobility  Bed Mobility   Bed Mobility: Sit to Supine       Sit to supine: Min guard   General bed mobility comments: instructed in use of leg lifter, patient very appreciative of  how it helped. sister to buy one.  Transfers   Equipment used: Rolling walker (2 wheeled) Transfers: Sit to/from Stand Sit to Stand: [employer] transfer comment: cues for UE/LE placement  Ambulation/Gait Ambulation/Gait assistance: Min assist Ambulation Distance (Feet): 60 Feet Assistive device: Rolling walker (2 wheeled) Gait Pattern/deviations: Step-to pattern;Antalgic;Decreased step length - right     General Gait Details: cues for sequence   Stairs            Wheelchair Mobility    Modified Rankin (Stroke Patients Only)       Balance                                    Cognition Arousal/Alertness: Awake/alert                          Exercises Total Joint Exercises Ankle Circles/Pumps: AROM;Supine Quad Sets: AROM;Both;10 reps;Supine Heel Slides: AAROM;Right;10 reps;Supine Straight Leg Raises: AAROM;Right;10 reps;Supine Goniometric ROM: 10-50    General Comments        Pertinent Vitals/Pain Pain Score: 3  Pain Location: R knee Pain Descriptors / Indicators: Aching    Home Living                      Prior Function             PT Goals (current goals can now be found in the care plan section) Progress towards PT goals: Progressing toward goals    Frequency  7X/week    PT Plan Current plan remains appropriate    Co-evaluation             End of Session Equipment Utilized During Treatment: Right knee immobilizer Activity Tolerance: Patient tolerated treatment well Patient left: in bed;with call bell/phone within reach;with family/visitor present     Time: 9528-4132 PT Time Calculation (min): 44 min  Charges:  $Gait Training: 8-22 mins $Therapeutic Exercise: 8-22 mins $Self Care/Home Management: 09/30/2022                    G Codes:      Claretha Cooper 11/19/2013, 1:02 PM

## 2013-11-20 LAB — BASIC METABOLIC PANEL
ANION GAP: 11 (ref 5–15)
BUN: 9 mg/dL (ref 6–23)
CO2: 27 mEq/L (ref 19–32)
Calcium: 9.1 mg/dL (ref 8.4–10.5)
Chloride: 103 mEq/L (ref 96–112)
Creatinine, Ser: 0.63 mg/dL (ref 0.50–1.10)
GFR, EST NON AFRICAN AMERICAN: 86 mL/min — AB (ref >90)
Glucose, Bld: 102 mg/dL — ABNORMAL HIGH (ref 70–99)
POTASSIUM: 3.9 meq/L (ref 3.7–5.3)
SODIUM: 141 meq/L (ref 137–147)

## 2013-11-20 LAB — CBC
HCT: 35.2 % — ABNORMAL LOW (ref 36.0–46.0)
Hemoglobin: 12 g/dL (ref 12.0–15.0)
MCH: 30.8 pg (ref 26.0–34.0)
MCHC: 34.1 g/dL (ref 30.0–36.0)
MCV: 90.3 fL (ref 78.0–100.0)
Platelets: 255 10*3/uL (ref 150–400)
RBC: 3.9 MIL/uL (ref 3.87–5.11)
RDW: 13.1 % (ref 11.5–15.5)
WBC: 12.5 10*3/uL — ABNORMAL HIGH (ref 4.0–10.5)

## 2013-11-20 NOTE — Progress Notes (Signed)
Physical Therapy Treatment Patient Details Name: KEYMORA GRILLOT MRN: [id number] DOB: 10-20-1940 Today's Date: 11/20/2013    History of Present Illness RTKA    PT Comments    Ready for DC  Follow Up Recommendations  Home health PT;Supervision/Assistance - 24 hour     Equipment Recommendations       Recommendations for Other Services       Precautions / Restrictions Precautions Precautions: Knee    Mobility  Bed Mobility Overal bed mobility: Modified Independent             General bed mobility comments: using  leg lifter  Transfers   Equipment used: Rolling walker (2 wheeled) Transfers: Sit to/from Stand Sit to Stand: Supervision            Ambulation/Gait Ambulation/Gait assistance: Supervision Ambulation Distance (Feet): 80 Feet Assistive device: Rolling walker (2 wheeled) Gait Pattern/deviations: Step-through pattern     General Gait Details: cues for sequence   Stairs            Wheelchair Mobility    Modified Rankin (Stroke Patients Only)       Balance                                    Cognition Arousal/Alertness: Awake/alert                          Exercises Total Joint Exercises Ankle Circles/Pumps: AROM;Supine Quad Sets: AROM;Both;10 reps;Supine Towel Squeeze: AROM;Both;10 reps;Supine Short Arc Quad: AROM;Right;10 reps;Supine Heel Slides: AAROM;Right;10 reps;Supine Hip ABduction/ADduction: AAROM;Right;10 reps;Supine Straight Leg Raises: AAROM;Right;10 reps;Supine Knee Flexion: AROM;10 reps;Seated Goniometric ROM: 10-50 R     General Comments        Pertinent Vitals/Pain Pain Score: 4  Pain Location: R knee Pain Descriptors / Indicators: Discomfort Pain Intervention(s): Premedicated before session    Home Living                      Prior Function            PT Goals (current goals can now be found in the care plan section) Progress towards PT goals: Progressing toward  goals    Frequency       PT Plan Current plan remains appropriate    Co-evaluation             End of Session   Activity Tolerance: Patient tolerated treatment well Patient left: in bed;with call bell/phone within reach;with family/visitor present     Time: 1137-1204 PT Time Calculation (min): 27 min  Charges:  $Gait Training: 8-22 mins $Therapeutic Exercise: 8-22 mins                    G Codes:      Claretha Cooper 11/20/2013, 1:13 PM

## 2013-11-20 NOTE — Progress Notes (Signed)
   Subjective: 2 Days Post-Op Procedure(s) (LRB): TOTAL RIGHT KNEE ARTHROPLASTY (Right) Patient reports pain as mild.   Patient seen in rounds with Dr. Wynelle Link. Patient is well, and has had no acute complaints or problems Patient is ready to go home  Objective: Vital signs in last 24 hours: Temp:  [98.4 F (36.9 C)-99.3 F (37.4 C)] 99 F (37.2 C) (11/04 0636) Pulse Rate:  [62-73] 73 (11/04 0636) Resp:  [16-18] 16 (11/04 0749) BP: (121-151)/(60-77) 151/65 mmHg (11/04 0636) SpO2:  [92 %-98 %] 97 % (11/04 0636)  Intake/Output from previous day:  Intake/Output Summary (Last 24 hours) at 11/20/13 0825 Last data filed at 11/19/13 1754  Gross per 24 hour  Intake 424.08 ml  Output    700 ml  Net -275.92 ml   Labs:  Recent Labs  11/19/13 0430 11/20/13 0437  HGB 12.2 12.0    Recent Labs  11/19/13 0430 11/20/13 0437  WBC 11.8* 12.5*  RBC 3.89 3.90  HCT 34.7* 35.2*  PLT 243 255    Recent Labs  11/19/13 0430 11/20/13 0437  NA 136* 141  K 4.0 3.9  CL 103 103  CO2 24 27  BUN 13 9  CREATININE 0.73 0.63  GLUCOSE 120* 102*  CALCIUM 8.4 9.1   No results for input(s): LABPT, INR in the last 72 hours.  EXAM: General - Patient is Alert, Appropriate and Oriented Extremity - Neurovascular intact Sensation intact distally Dorsiflexion/Plantar flexion intact Incision - clean, dry, no drainage, healing Motor Function - intact, moving foot and toes well on exam.   Assessment/Plan: 2 Days Post-Op Procedure(s) (LRB): TOTAL RIGHT KNEE ARTHROPLASTY (Right) Procedure(s) (LRB): TOTAL RIGHT KNEE ARTHROPLASTY (Right) Past Medical History  Diagnosis Date  . Hemorrhoids     JUST OCCAS BLOOD - NO PROBLEMS AT PRESENT  . History of shingles     ABOUT 2012 - NO RESIDUAL PROBLEMS  . Cough     DRY COUGH - / ?ALLERGY RELATED  . Arthritis     PAIN AND OA RIGHT KNEE   Principal Problem:   OA (osteoarthritis) of knee  Estimated body mass index is 30.9 kg/(m^2) as calculated  from the following:   Height as of this encounter: 5\' 2"  (1.575 m).   Weight as of this encounter: 76.658 kg (169 lb). Up with therapy Discharge home with home health Diet - Regular diet Follow up - in 2 weeks Activity - WBAT Disposition - Home Condition Upon Discharge - Good D/C Meds - See DC Summary DVT Prophylaxis - Xarelto for a total of three weeks  Arlee Muslim, PA-C Orthopaedic Surgery 11/20/2013, 8:25 AM

## 2013-11-20 NOTE — Progress Notes (Signed)
Pt to d/c home with [facility] home [facility]. Pt received 3n1 before d/c. AVS reviewed and "My Chart" discussed with pt. Pt capable of verbalizing medications, dressing changes, signs and symptoms of infection, and follow-up appointments. Remains hemodynamically stable. No signs and symptoms of distress. Educated pt to return to ER in the case of SOB, dizziness, or chest pain.

## 2013-11-20 NOTE — Plan of Care (Signed)
Problem: Consults Goal: Total Joint Replacement Patient Education See Patient Education Module for education specifics.  Outcome: Completed/Met Date Met:  11/20/13 Goal: Diagnosis- Total Joint Replacement Outcome: Completed/Met Date Met:  11/20/13 Primary Total Knee RIGHT Goal: Skin Care Protocol Initiated - if Braden Score 18 or less If consults are not indicated, leave blank or document N/A  Outcome: Not Applicable Date Met:  87/57/97 Goal: Nutrition Consult-if indicated Outcome: Not Applicable Date Met:  28/20/60 Goal: Diabetes Guidelines if Diabetic/Glucose > 140 If diabetic or lab glucose is > 140 mg/dl - Initiate Diabetes/Hyperglycemia Guidelines & Document Interventions  Outcome: Not Applicable Date Met:  15/61/53  Problem: Phase I Progression Outcomes Goal: CMS/Neurovascular status WDL Outcome: Completed/Met Date Met:  11/20/13 Goal: Pain controlled with appropriate interventions Outcome: Completed/Met Date Met:  11/20/13 Goal: Dangle or out of bed evening of surgery Outcome: Completed/Met Date Met:  11/20/13 Goal: Initial discharge plan identified Outcome: Completed/Met Date Met:  11/20/13 Goal: Other Phase I Outcomes/Goals Outcome: Not Applicable Date Met:  79/43/27  Problem: Phase II Progression Outcomes Goal: Ambulates Outcome: Completed/Met Date Met:  11/20/13 Goal: Discharge plan established Outcome: Completed/Met Date Met:  11/20/13 Goal: Other Phase II Outcomes/Goals Outcome: Not Applicable Date Met:  61/47/09  Problem: Phase III Progression Outcomes Goal: Pain controlled on oral analgesia Outcome: Completed/Met Date Met:  11/20/13 Goal: Ambulates Outcome: Completed/Met Date Met:  11/20/13 Goal: Discharge plan remains appropriate-arrangements made Outcome: Completed/Met Date Met:  11/20/13 Goal: Anticoagulant follow-up in place Outcome: Not Applicable Date Met:  29/57/47 Xarelto VTE, no f/u needed. Goal: Other Phase III Outcomes/Goals Outcome: Not  Applicable Date Met:  34/03/70

## 2013-11-21 DIAGNOSIS — R269 Unspecified abnormalities of gait and mobility: Secondary | ICD-10-CM | POA: Diagnosis not present

## 2013-11-21 DIAGNOSIS — Z96651 Presence of right artificial knee joint: Secondary | ICD-10-CM | POA: Diagnosis not present

## 2013-11-21 DIAGNOSIS — Z471 Aftercare following joint replacement surgery: Secondary | ICD-10-CM | POA: Diagnosis not present

## 2013-11-22 DIAGNOSIS — Z471 Aftercare following joint replacement surgery: Secondary | ICD-10-CM | POA: Diagnosis not present

## 2013-11-22 DIAGNOSIS — R269 Unspecified abnormalities of gait and mobility: Secondary | ICD-10-CM | POA: Diagnosis not present

## 2013-11-22 DIAGNOSIS — Z96651 Presence of right artificial knee joint: Secondary | ICD-10-CM | POA: Diagnosis not present

## 2013-11-25 DIAGNOSIS — R269 Unspecified abnormalities of gait and mobility: Secondary | ICD-10-CM | POA: Diagnosis not present

## 2013-11-25 DIAGNOSIS — Z96651 Presence of right artificial knee joint: Secondary | ICD-10-CM | POA: Diagnosis not present

## 2013-11-25 DIAGNOSIS — Z471 Aftercare following joint replacement surgery: Secondary | ICD-10-CM | POA: Diagnosis not present

## 2013-11-26 DIAGNOSIS — Z96651 Presence of right artificial knee joint: Secondary | ICD-10-CM | POA: Diagnosis not present

## 2013-11-26 DIAGNOSIS — R269 Unspecified abnormalities of gait and mobility: Secondary | ICD-10-CM | POA: Diagnosis not present

## 2013-11-26 DIAGNOSIS — Z471 Aftercare following joint replacement surgery: Secondary | ICD-10-CM | POA: Diagnosis not present

## 2013-11-29 DIAGNOSIS — R269 Unspecified abnormalities of gait and mobility: Secondary | ICD-10-CM | POA: Diagnosis not present

## 2013-11-29 DIAGNOSIS — Z96651 Presence of right artificial knee joint: Secondary | ICD-10-CM | POA: Diagnosis not present

## 2013-11-29 DIAGNOSIS — Z471 Aftercare following joint replacement surgery: Secondary | ICD-10-CM | POA: Diagnosis not present

## 2013-12-02 DIAGNOSIS — R269 Unspecified abnormalities of gait and mobility: Secondary | ICD-10-CM | POA: Diagnosis not present

## 2013-12-02 DIAGNOSIS — Z471 Aftercare following joint replacement surgery: Secondary | ICD-10-CM | POA: Diagnosis not present

## 2013-12-02 DIAGNOSIS — Z96651 Presence of right artificial knee joint: Secondary | ICD-10-CM | POA: Diagnosis not present

## 2013-12-04 DIAGNOSIS — Z471 Aftercare following joint replacement surgery: Secondary | ICD-10-CM | POA: Diagnosis not present

## 2013-12-04 DIAGNOSIS — Z96651 Presence of right artificial knee joint: Secondary | ICD-10-CM | POA: Diagnosis not present

## 2013-12-04 DIAGNOSIS — R269 Unspecified abnormalities of gait and mobility: Secondary | ICD-10-CM | POA: Diagnosis not present

## 2013-12-06 DIAGNOSIS — R269 Unspecified abnormalities of gait and mobility: Secondary | ICD-10-CM | POA: Diagnosis not present

## 2013-12-06 DIAGNOSIS — Z96651 Presence of right artificial knee joint: Secondary | ICD-10-CM | POA: Diagnosis not present

## 2013-12-06 DIAGNOSIS — Z471 Aftercare following joint replacement surgery: Secondary | ICD-10-CM | POA: Diagnosis not present

## 2013-12-09 DIAGNOSIS — M1711 Unilateral primary osteoarthritis, right knee: Secondary | ICD-10-CM | POA: Diagnosis not present

## 2013-12-11 DIAGNOSIS — M1711 Unilateral primary osteoarthritis, right knee: Secondary | ICD-10-CM | POA: Diagnosis not present

## 2013-12-16 DIAGNOSIS — M1711 Unilateral primary osteoarthritis, right knee: Secondary | ICD-10-CM | POA: Diagnosis not present

## 2013-12-18 DIAGNOSIS — M1711 Unilateral primary osteoarthritis, right knee: Secondary | ICD-10-CM | POA: Diagnosis not present

## 2013-12-20 DIAGNOSIS — M1711 Unilateral primary osteoarthritis, right knee: Secondary | ICD-10-CM | POA: Diagnosis not present

## 2013-12-23 DIAGNOSIS — M1711 Unilateral primary osteoarthritis, right knee: Secondary | ICD-10-CM | POA: Diagnosis not present

## 2013-12-24 DIAGNOSIS — Z96651 Presence of right artificial knee joint: Secondary | ICD-10-CM | POA: Diagnosis not present

## 2013-12-24 DIAGNOSIS — Z471 Aftercare following joint replacement surgery: Secondary | ICD-10-CM | POA: Diagnosis not present

## 2013-12-25 DIAGNOSIS — M1711 Unilateral primary osteoarthritis, right knee: Secondary | ICD-10-CM | POA: Diagnosis not present

## 2013-12-27 DIAGNOSIS — M1711 Unilateral primary osteoarthritis, right knee: Secondary | ICD-10-CM | POA: Diagnosis not present

## 2013-12-30 DIAGNOSIS — M1711 Unilateral primary osteoarthritis, right knee: Secondary | ICD-10-CM | POA: Diagnosis not present

## 2014-01-01 DIAGNOSIS — M1711 Unilateral primary osteoarthritis, right knee: Secondary | ICD-10-CM | POA: Diagnosis not present

## 2014-01-03 DIAGNOSIS — M1711 Unilateral primary osteoarthritis, right knee: Secondary | ICD-10-CM | POA: Diagnosis not present

## 2014-01-07 DIAGNOSIS — M1711 Unilateral primary osteoarthritis, right knee: Secondary | ICD-10-CM | POA: Diagnosis not present

## 2014-01-15 DIAGNOSIS — M1711 Unilateral primary osteoarthritis, right knee: Secondary | ICD-10-CM | POA: Diagnosis not present

## 2014-01-20 DIAGNOSIS — M1711 Unilateral primary osteoarthritis, right knee: Secondary | ICD-10-CM | POA: Diagnosis not present

## 2014-01-22 DIAGNOSIS — M1711 Unilateral primary osteoarthritis, right knee: Secondary | ICD-10-CM | POA: Diagnosis not present

## 2014-01-24 DIAGNOSIS — M1711 Unilateral primary osteoarthritis, right knee: Secondary | ICD-10-CM | POA: Diagnosis not present

## 2014-01-27 DIAGNOSIS — M1711 Unilateral primary osteoarthritis, right knee: Secondary | ICD-10-CM | POA: Diagnosis not present

## 2014-01-28 DIAGNOSIS — Z96651 Presence of right artificial knee joint: Secondary | ICD-10-CM | POA: Diagnosis not present

## 2014-01-28 DIAGNOSIS — Z471 Aftercare following joint replacement surgery: Secondary | ICD-10-CM | POA: Diagnosis not present

## 2014-02-03 DIAGNOSIS — D485 Neoplasm of uncertain behavior of skin: Secondary | ICD-10-CM | POA: Diagnosis not present

## 2014-02-03 DIAGNOSIS — L570 Actinic keratosis: Secondary | ICD-10-CM | POA: Diagnosis not present

## 2014-02-03 DIAGNOSIS — L821 Other seborrheic keratosis: Secondary | ICD-10-CM | POA: Diagnosis not present

## 2014-02-03 DIAGNOSIS — Z85828 Personal history of other malignant neoplasm of skin: Secondary | ICD-10-CM | POA: Diagnosis not present

## 2014-04-25 ENCOUNTER — Other Ambulatory Visit: Payer: Self-pay

## 2014-04-25 DIAGNOSIS — Z1231 Encounter for screening mammogram for malignant neoplasm of breast: Secondary | ICD-10-CM

## 2014-05-08 DIAGNOSIS — Z471 Aftercare following joint replacement surgery: Secondary | ICD-10-CM | POA: Diagnosis not present

## 2014-05-08 DIAGNOSIS — Z96651 Presence of right artificial knee joint: Secondary | ICD-10-CM | POA: Diagnosis not present

## 2014-05-12 DIAGNOSIS — Z136 Encounter for screening for cardiovascular disorders: Secondary | ICD-10-CM | POA: Diagnosis not present

## 2014-05-12 DIAGNOSIS — Z0000 Encounter for general adult medical examination without abnormal findings: Secondary | ICD-10-CM | POA: Diagnosis not present

## 2014-05-12 DIAGNOSIS — Z23 Encounter for immunization: Secondary | ICD-10-CM | POA: Diagnosis not present

## 2014-05-15 DIAGNOSIS — H01004 Unspecified blepharitis left upper eyelid: Secondary | ICD-10-CM | POA: Diagnosis not present

## 2014-05-15 DIAGNOSIS — H01001 Unspecified blepharitis right upper eyelid: Secondary | ICD-10-CM | POA: Diagnosis not present

## 2014-05-15 DIAGNOSIS — H35433 Paving stone degeneration of retina, bilateral: Secondary | ICD-10-CM | POA: Diagnosis not present

## 2014-05-15 DIAGNOSIS — Z961 Presence of intraocular lens: Secondary | ICD-10-CM | POA: Diagnosis not present

## 2014-05-16 ENCOUNTER — Ambulatory Visit
Admission: RE | Admit: 2014-05-16 | Discharge: 2014-05-16 | Disposition: A | Discharge status: Home / Self Care | Payer: Medicare Other | Source: Ambulatory Visit

## 2014-05-16 DIAGNOSIS — Z1231 Encounter for screening mammogram for malignant neoplasm of breast: Secondary | ICD-10-CM

## 2014-11-20 DIAGNOSIS — Z471 Aftercare following joint replacement surgery: Secondary | ICD-10-CM | POA: Diagnosis not present

## 2014-11-20 DIAGNOSIS — M1611 Unilateral primary osteoarthritis, right hip: Secondary | ICD-10-CM | POA: Diagnosis not present

## 2014-11-20 DIAGNOSIS — Z96651 Presence of right artificial knee joint: Secondary | ICD-10-CM | POA: Diagnosis not present

## 2014-11-20 DIAGNOSIS — M160 Bilateral primary osteoarthritis of hip: Secondary | ICD-10-CM | POA: Diagnosis not present

## 2015-02-05 DIAGNOSIS — L720 Epidermal cyst: Secondary | ICD-10-CM | POA: Diagnosis not present

## 2015-02-05 DIAGNOSIS — Z85828 Personal history of other malignant neoplasm of skin: Secondary | ICD-10-CM | POA: Diagnosis not present

## 2015-02-05 DIAGNOSIS — L821 Other seborrheic keratosis: Secondary | ICD-10-CM | POA: Diagnosis not present

## 2015-02-05 DIAGNOSIS — D692 Other nonthrombocytopenic purpura: Secondary | ICD-10-CM | POA: Diagnosis not present

## 2015-02-05 DIAGNOSIS — D1801 Hemangioma of skin and subcutaneous tissue: Secondary | ICD-10-CM | POA: Diagnosis not present

## 2015-02-05 DIAGNOSIS — L570 Actinic keratosis: Secondary | ICD-10-CM | POA: Diagnosis not present

## 2015-02-23 ENCOUNTER — Ambulatory Visit: Payer: Self-pay | Admitting: Orthopedic Surgery

## 2015-02-23 NOTE — Progress Notes (Signed)
Preoperative surgical orders have been place into the Epic hospital system for IVER ABEGG on 02/23/2015, 11:08 AM  by Mickel Crow for surgery on 03-25-15.  Preop Total Hip - Anterior Approach orders including IV Tylenol, and IV Decadron as long as there are no contraindications to the above medications. Arlee Muslim, PA-C

## 2015-02-26 ENCOUNTER — Ambulatory Visit: Payer: Self-pay | Admitting: Orthopedic Surgery

## 2015-02-26 NOTE — H&P (Signed)
Jamie Galloway DOB: [birth year] Married / Language: English / Race: White Female Date of Admission:  03/25/2015 CC:  Right Hip and Thigh Pain History of Present Illness The patient is a 75 year old female who comes in for a preoperative History and Physical. The patient is scheduled for a right total hip arthroplasty (anterior) to be performed by Dr. Dione Plover. Aluisio, MD at [facility] on 03-25-2015 . Please see the hospital record for complete dictated history and physical. The patient is a 75 year old female who comes in today 1 year out from right total knee arthroplasty. The patient states that she is doing fair at this time. The pain with regards to the knee is under fair control at this time and describe their pain as mild. She feels that her ROM is not as well as she hoped it would be and makes it difficult to ambulate. She is still using a walker due to the her pain. She feels that the right leg is longer than the left which aggravates her back too. She takes Advil daily. She is very dissatisfied with how she is walking. She states she feels very stiff legged. The knee itself is not hurting, but she feels like she cannot get around well. She is having pain in both of her hips and has limited motion of the right hip as compared to the left hip. 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 Due to to the continued issues with ambulation and the significant findings on radiographs, it is felt that the patient would benefit from undergoing a total hip replacement. They have been treated conservatively in the past for the above stated problem and despite conservative measures, they continue to have progressive pain and severe functional limitations and dysfunction. They have failed non-operative management including home exercise, medications. It is felt that they would benefit from undergoing total joint replacement. Risks and benefits of the  procedure have been discussed with the patient and they elect to proceed with surgery. There are no active contraindications to surgery such as ongoing infection or rapidly progressive neurological disease.  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 Osteoarthritis  Mother, Paternal Grandmother. Osteoporosis  Mother.  Social History  Not under pain contract  Number of flights of stairs before winded  2-3 No history of drug/alcohol rehab  Living situation  live with spouse Marital status  married Tobacco / smoke exposure  07/05/2013: no Tobacco use  Former smoker. 07/05/2013: smoke(d) less than 1/2 pack(s) per day Exercise  Exercises daily; does running / walking and other Current drinker  07/05/2013: Currently drinks wine 5-7 times per week Current work status  retired Children  2 Advance Directives  Living Will Post-Surgical Plans  Home  Medication History Advil (200MG  Tablet, 2 Oral in the morning) Active.   Past Surgical History  Tubal Ligation  Cataract Surgery  bilateral Tonsillectomy  Total Knee Replacement - Right  Date: 11/2013.    Review of Systems General Not Present- Chills, Fatigue, Fever, Memory Loss, Night Sweats, Weight Gain and Weight Loss. Skin Not Present- Eczema, Hives, Itching, Lesions and Rash. HEENT  Not Present- Dentures, Double Vision, Headache, Hearing Loss, Tinnitus and Visual Loss. Respiratory Not Present- Allergies, Chronic Cough, Coughing up blood, Shortness of breath at rest and Shortness of breath with exertion. Cardiovascular Not Present- Chest Pain, Difficulty  Breathing Lying Down, Murmur, Palpitations, Racing/skipping heartbeats and Swelling. Gastrointestinal Not Present- Abdominal Pain, Bloody Stool, Constipation, Diarrhea, Difficulty Swallowing, Heartburn, Jaundice, Loss of appetitie, Nausea and Vomiting. Female Genitourinary Not Present- Blood in Urine, Discharge, Flank Pain, Incontinence, Painful Urination, Urgency, Urinary frequency, Urinary Retention, Urinating at Night and Weak urinary stream. Musculoskeletal Present- Joint Pain and Morning Stiffness. Not Present- Back Pain, Joint Swelling, Muscle Pain, Muscle Weakness and Spasms. Neurological Not Present- Blackout spells, Difficulty with balance, Dizziness, Paralysis, Tremor and Weakness. Psychiatric Not Present- Insomnia.  Vitals Weight: 152 lb Height: 62in Weight was reported by patient. Height was reported by patient. Body Surface Area: 1.7 m Body Mass Index: 27.8 kg/m  Pulse: 76 (Regular)  BP: 128/78 (Sitting, Left Arm, Standard)   Physical Exam General Mental Status -Alert, cooperative and good historian. General Appearance-pleasant, Not in acute distress. Orientation-Oriented X3. Build & Nutrition-Well nourished and Well developed.  Head and Neck Head-normocephalic, atraumatic . Neck Global Assessment - supple, no bruit auscultated on the right, no bruit auscultated on the left.  Eye Pupil - Bilateral-Regular and Round. Motion - Bilateral-EOMI.  Chest and Lung Exam Auscultation Breath sounds - clear at anterior chest wall and clear at posterior chest wall. Adventitious sounds - No Adventitious sounds.  Cardiovascular Auscultation Rhythm - Regular rate and rhythm. Heart Sounds - S1 WNL and S2 WNL. Murmurs & Other Heart Sounds - Auscultation of the heart reveals - No Murmurs.  Abdomen Palpation/Percussion Tenderness - Abdomen is non-tender to palpation. Rigidity (guarding) - Abdomen is soft. Auscultation Auscultation of the abdomen reveals -  Bowel sounds normal.  Female Genitourinary Note: Not done, not pertinent to present illness   Musculoskeletal Note: On exam, she is alert and oriented, in no apparent distress. Evaluation of her right knee shows no swelling. Range of motion is about 5 to 125 degrees. There is no instability about the right knee. Her right hip shows flexion to 90 degrees, no internal rotation, about 10 degrees of external rotation, 10 to 20 degrees of abduction. Left hip has similar range of motion.  RADIOGRAPHS We obtained radiographs of the right knee, AP and lateral, her prosthesis is in excellent position with no periprosthetic abnormalities. 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  Assessment & Plan  Status post total right knee replacement [id number]) Primary osteoarthritis of right hip (M16.11)  Note:Surgical Plans: Right Total Hip Replacement - Anterior Approach  Disposition: Home  PCP: Dr. Radene Ou  IV TXA  Anesthesia Issues: None  Signed electronically by Joelene Millin, III PA-C

## 2015-02-26 NOTE — H&P (Signed)
Arnetta Wubben Harrower DOB: 25-Jun-1940 Married / Language: English / Race: White Female Date of Admission: 03/25/2015 CC: Right Hip and Thigh Pain History of Present Illness The patient is a 75 year old female who comes in for a preoperative History and Physical. The patient is scheduled for a right total hip arthroplasty (anterior) to be performed by Dr. Dione Plover. Aluisio, MD at [facility] on 03-25-2015 . Please see the hospital record for complete dictated history and physical. The patient is a 75 year old female who comes in today 1 year out from right total knee arthroplasty. The patient states that she is doing fair at this time. The pain with regards to the knee is under fair control at this time and describe their pain as mild. She feels that her ROM is not as well as she hoped it would be and makes it difficult to ambulate. She is still using a walker due to the her pain. She feels that the right leg is longer than the left which aggravates her back too. She takes Advil daily. She is very dissatisfied with how she is walking. She states she feels very stiff legged. The knee itself is not hurting, but she feels like she cannot get around well. She is having pain in both of her hips and has limited motion of the right hip as compared to the left hip. 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 Due to to the continued issues with ambulation and the significant findings on radiographs, it is felt that the patient would benefit from undergoing a total hip replacement. They have been treated conservatively in the past for the above stated problem and despite conservative measures, they continue to have progressive pain and severe functional limitations and dysfunction. They have failed non-operative management including home exercise, medications. It is felt that they would benefit from undergoing total joint replacement. Risks and benefits of the  procedure have been discussed with the patient and they elect to proceed with surgery. There are no active contraindications to surgery such as ongoing infection or rapidly progressive neurological disease.  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 Osteoarthritis  Mother, Paternal Grandmother. Osteoporosis  Mother.  Social History  Not under pain contract  Number of flights of stairs before winded  2-3 No history of drug/alcohol rehab  Living situation  live with spouse Marital status  married Tobacco / smoke exposure  07/05/2013: no Tobacco use  Former smoker. 07/05/2013: smoke(d) less than 1/2 pack(s) per day Exercise  Exercises daily; does running / walking and other Current drinker  07/05/2013: Currently drinks wine 5-7 times per week Current work status  retired Children  2 Advance Directives  Living Will Post-Surgical Plans  Home  Medication History Advil (200MG  Tablet, 2 Oral in the morning) Active.   Past Surgical History  Tubal Ligation  Cataract Surgery  bilateral Tonsillectomy  Total Knee Replacement - Right  Date: 11/2013.   Review of Systems General Not Present- Chills, Fatigue, Fever, Memory Loss, Night Sweats, Weight Gain and Weight Loss. Skin Not Present- Eczema, Hives, Itching, Lesions and Rash. HEENT Not Present- Dentures,  Double Vision, Headache, Hearing Loss, Tinnitus and Visual Loss. Respiratory Not Present- Allergies, Chronic Cough, Coughing up blood, Shortness of breath at rest and Shortness of breath with exertion. Cardiovascular Not Present- Chest Pain, Difficulty  Breathing Lying Down, Murmur, Palpitations, Racing/skipping heartbeats and Swelling. Gastrointestinal Not Present- Abdominal Pain, Bloody Stool, Constipation, Diarrhea, Difficulty Swallowing, Heartburn, Jaundice, Loss of appetitie, Nausea and Vomiting. Female Genitourinary Not Present- Blood in Urine, Discharge, Flank Pain, Incontinence, Painful Urination, Urgency, Urinary frequency, Urinary Retention, Urinating at Night and Weak urinary stream. Musculoskeletal Present- Joint Pain and Morning Stiffness. Not Present- Back Pain, Joint Swelling, Muscle Pain, Muscle Weakness and Spasms. Neurological Not Present- Blackout spells, Difficulty with balance, Dizziness, Paralysis, Tremor and Weakness. Psychiatric Not Present- Insomnia.  Vitals Weight: 152 lb Height: 62in Weight was reported by patient. Height was reported by patient. Body Surface Area: 1.7 m Body Mass Index: 27.8 kg/m  Pulse: 76 (Regular)  BP: 128/78 (Sitting, Left Arm, Standard)   Physical Exam General Mental Status -Alert, cooperative and good historian. General Appearance-pleasant, Not in acute distress. Orientation-Oriented X3. Build & Nutrition-Well nourished and Well developed.  Head and Neck Head-normocephalic, atraumatic . Neck Global Assessment - supple, no bruit auscultated on the right, no bruit auscultated on the left.  Eye Pupil - Bilateral-Regular and Round. Motion - Bilateral-EOMI.  Chest and Lung Exam Auscultation Breath sounds - clear at anterior chest wall and clear at posterior chest wall. Adventitious sounds - No Adventitious sounds.  Cardiovascular Auscultation Rhythm - Regular rate and rhythm. Heart Sounds - S1 WNL and S2 WNL. Murmurs & Other Heart Sounds - Auscultation of the heart reveals - No Murmurs.  Abdomen Palpation/Percussion Tenderness - Abdomen is non-tender to palpation. Rigidity (guarding) - Abdomen is soft. Auscultation Auscultation of the abdomen reveals -  Bowel sounds normal.  Female Genitourinary Note: Not done, not pertinent to present illness   Musculoskeletal Note: On exam, she is alert and oriented, in no apparent distress. Evaluation of her right knee shows no swelling. Range of motion is about 5 to 125 degrees. There is no instability about the right knee. Her right hip shows flexion to 90 degrees, no internal rotation, about 10 degrees of external rotation, 10 to 20 degrees of abduction. Left hip has similar range of motion.  RADIOGRAPHS We obtained radiographs of the right knee, AP and lateral, her prosthesis is in excellent position with no periprosthetic abnormalities. 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  Assessment & Plan  Status post total right knee replacement [id number]) Primary osteoarthritis of right hip (M16.11)  Note:Surgical Plans: Right Total Hip Replacement - Anterior Approach  Disposition: Home  PCP: Dr. Radene Ou  IV TXA  Anesthesia Issues: None  Signed electronically by Joelene Millin, III PA-C

## 2015-03-04 DIAGNOSIS — Z01818 Encounter for other preprocedural examination: Secondary | ICD-10-CM | POA: Diagnosis not present

## 2015-03-04 DIAGNOSIS — Z23 Encounter for immunization: Secondary | ICD-10-CM | POA: Diagnosis not present

## 2015-03-04 DIAGNOSIS — M169 Osteoarthritis of hip, unspecified: Secondary | ICD-10-CM | POA: Diagnosis not present

## 2015-03-09 NOTE — Progress Notes (Signed)
Patient wants pst appt on Thursdays as has caregiver for husband that day

## 2015-03-18 NOTE — Patient Instructions (Addendum)
YOUR PROCEDURE IS SCHEDULED ON :  03/25/15  REPORT TO [facility] MAIN ENTRANCE FOLLOW SIGNS TO EAST ELEVATOR - GO TO 3rd FLOOR CHECK IN AT 3 EAST NURSES STATION (SHORT STAY) AT:  6:30 AM  CALL THIS NUMBER IF YOU HAVE PROBLEMS THE MORNING OF SURGERY [phone]  REMEMBER:ONLY 1 PER PERSON MAY GO TO SHORT STAY WITH YOU TO GET READY THE MORNING OF YOUR SURGERY  DO NOT EAT FOOD OR DRINK LIQUIDS AFTER MIDNIGHT  TAKE THESE MEDICINES THE MORNING OF SURGERY: none  YOU MAY NOT HAVE ANY METAL ON YOUR BODY INCLUDING HAIR PINS AND PIERCING'S. DO NOT WEAR JEWELRY, MAKEUP, LOTIONS, POWDERS OR PERFUMES. DO NOT WEAR NAIL POLISH. DO NOT SHAVE 48 HRS PRIOR TO SURGERY. MEN MAY SHAVE FACE AND NECK.  DO NOT [place]. [facility] IS NOT RESPONSIBLE FOR VALUABLES.  CONTACTS, DENTURES OR PARTIALS MAY NOT BE WORN TO SURGERY. LEAVE SUITCASE IN CAR. CAN BE BROUGHT TO ROOM AFTER SURGERY.  PATIENTS DISCHARGED THE DAY OF SURGERY WILL NOT BE ALLOWED TO DRIVE HOME.  PLEASE READ OVER THE FOLLOWING INSTRUCTION SHEETS _________________________________________________________________________________                                          [facility] - PREPARING FOR SURGERY  Before surgery, you can play an important role.  Because skin is not sterile, your skin needs to be as free of germs as possible.  You can reduce the number of germs on your skin by washing with CHG (chlorahexidine gluconate) soap before surgery.  CHG is an antiseptic cleaner which kills germs and bonds with the skin to continue killing germs even after washing. Please DO NOT use if you have an allergy to CHG or antibacterial soaps.  If your skin becomes reddened/irritated stop using the CHG and inform your nurse when you arrive at Short Stay. Do not shave (including legs and underarms) for at least 48 hours prior to the first CHG shower.  You may shave your face. Please follow these instructions  carefully:   1.  Shower with CHG Soap the night before surgery and the  morning of Surgery.   2.  If you choose to wash your hair, wash your hair first as usual with your  normal  Shampoo.   3.  After you shampoo, rinse your hair and body thoroughly to remove the  shampoo.                                         4.  Use CHG as you would any other liquid soap.  You can apply chg directly  to the skin and wash . Gently wash with scrungie or clean wascloth    5.  Apply the CHG Soap to your body ONLY FROM THE NECK DOWN.   Do not use on open                           Wound or open sores. Avoid contact with eyes, ears mouth and genitals (private parts).                        Genitals (private parts) with your normal soap.  6.  Wash thoroughly, paying special attention to the area where your surgery  will be performed.   7.  Thoroughly rinse your body with warm water from the neck down.   8.  DO NOT shower/wash with your normal soap after using and rinsing off  the CHG Soap .                9.  Pat yourself dry with a clean towel.             10.  Wear clean night clothes to bed after shower             11.  Place clean sheets on your bed the night of your first shower and do not  sleep with pets.  Day of Surgery : Do not apply any lotions/deodorants the morning of surgery.  Please wear clean clothes to the hospital/surgery center.  FAILURE TO FOLLOW THESE INSTRUCTIONS MAY RESULT IN THE CANCELLATION OF YOUR SURGERY    PATIENT SIGNATURE_________________________________  ______________________________________________________________________     Jamie Galloway  An incentive spirometer is a tool that can help keep your lungs clear and active. This tool measures how well you are filling your lungs with each breath. Taking long deep breaths may help reverse or decrease the chance of developing breathing (pulmonary) problems (especially infection) following:  A long  period of time when you are unable to move or be active. BEFORE THE PROCEDURE   If the spirometer includes an indicator to show your best effort, your nurse or respiratory therapist will set it to a desired goal.  If possible, sit up straight or lean slightly forward. Try not to slouch.  Hold the incentive spirometer in an upright position. INSTRUCTIONS FOR USE   Sit on the edge of your bed if possible, or sit up as far as you can in bed or on a chair.  Hold the incentive spirometer in an upright position.  Breathe out normally.  Place the mouthpiece in your mouth and seal your lips tightly around it.  Breathe in slowly and as deeply as possible, raising the piston or the ball toward the top of the column.  Hold your breath for 3-5 seconds or for as long as possible. Allow the piston or ball to fall to the bottom of the column.  Remove the mouthpiece from your mouth and breathe out normally.  Rest for a few seconds and repeat Steps 1 through 7 at least 10 times every 1-2 hours when you are awake. Take your time and take a few normal breaths between deep breaths.  The spirometer may include an indicator to show your best effort. Use the indicator as a goal to work toward during each repetition.  After each set of 10 deep breaths, practice coughing to be sure your lungs are clear. If you have an incision (the cut made at the time of surgery), support your incision when coughing by placing a pillow or rolled up towels firmly against it. Once you are able to get out of bed, walk around indoors and cough well. You may stop using the incentive spirometer when instructed by your caregiver.  RISKS AND COMPLICATIONS  Take your time so you do not get dizzy or light-headed.  If you are in pain, you may need to take or ask for pain medication before doing incentive spirometry. It is harder to take a deep breath if you are having pain. AFTER USE  Rest and breathe slowly and easily.  It can  be helpful to keep track of a log of your progress. Your caregiver can provide you with a simple table to help with this. If you are using the spirometer at home, follow these instructions: Jamie Galloway IF:   You are having difficultly using the spirometer.  You have trouble using the spirometer as often as instructed.  Your pain medication is not giving enough relief while using the spirometer.  You develop fever of 100.5 F (38.1 C) or higher. SEEK IMMEDIATE MEDICAL CARE IF:   You cough up bloody sputum that had not been present before.  You develop fever of 102 F (38.9 C) or greater.  You develop worsening pain at or near the incision site. MAKE SURE YOU:   Understand these instructions.  Will watch your condition.  Will get help right away if you are not doing well or get worse. Document Released: 05/16/2006 Document Revised: 03/28/2011 Document Reviewed: 07/17/2006 [employer] Patient Information 2014 [employer], [place].   ________________________________________________________________________  WHAT IS A BLOOD TRANSFUSION? Blood Transfusion Information  A transfusion is the replacement of blood or some of its parts. Blood is made up of multiple cells which provide different functions.  Red blood cells carry oxygen and are used for blood loss replacement.  White blood cells fight against infection.  Platelets control bleeding.  Plasma helps clot blood.  Other blood products are available for specialized needs, such as hemophilia or other clotting disorders. BEFORE THE TRANSFUSION  Who gives blood for transfusions?   Healthy volunteers who are fully evaluated to make sure their blood is safe. This is blood bank blood. Transfusion therapy is the safest it has ever been in the practice of medicine. Before blood is taken from a donor, a complete history is taken to make sure that person has no history of diseases nor engages in risky social behavior (examples are  intravenous drug use or sexual activity with multiple partners). The donor's travel history is screened to minimize risk of transmitting infections, such as malaria. The donated blood is tested for signs of infectious diseases, such as HIV and hepatitis. The blood is then tested to be sure it is compatible with you in order to minimize the chance of a transfusion reaction. If you or a relative donates blood, this is often done in anticipation of surgery and is not appropriate for emergency situations. It takes many days to process the donated blood. RISKS AND COMPLICATIONS Although transfusion therapy is very safe and saves many lives, the main dangers of transfusion include:   Getting an infectious disease.  Developing a transfusion reaction. This is an allergic reaction to something in the blood you were given. Every precaution is taken to prevent this. The decision to have a blood transfusion has been considered carefully by your caregiver before blood is given. Blood is not given unless the benefits outweigh the risks. AFTER THE TRANSFUSION  Right after receiving a blood transfusion, you will usually feel much better and more energetic. This is especially true if your red blood cells have gotten low (anemic). The transfusion raises the level of the red blood cells which carry oxygen, and this usually causes an energy increase.  The nurse administering the transfusion will monitor you carefully for complications. HOME CARE INSTRUCTIONS  No special instructions are needed after a transfusion. You may find your energy is better. Speak with your caregiver about any limitations on activity for underlying diseases you may have. SEEK MEDICAL CARE IF:   Your  condition is not improving after your transfusion.  You develop redness or irritation at the intravenous (IV) site. SEEK IMMEDIATE MEDICAL CARE IF:  Any of the following symptoms occur over the next 12 hours:  Shaking chills.  You have a  temperature by mouth above 102 F (38.9 C), not controlled by medicine.  Chest, back, or muscle pain.  People around you feel you are not acting correctly or are confused.  Shortness of breath or difficulty breathing.  Dizziness and fainting.  You get a rash or develop hives.  You have a decrease in urine output.  Your urine turns a dark color or changes to pink, red, or brown. Any of the following symptoms occur over the next 10 days:  You have a temperature by mouth above 102 F (38.9 C), not controlled by medicine.  Shortness of breath.  Weakness after normal activity.  The white part of the eye turns yellow (jaundice).  You have a decrease in the amount of urine or are urinating less often.  Your urine turns a dark color or changes to pink, red, or brown. Document Released: 01/01/2000 Document Revised: 03/28/2011 Document Reviewed: 08/20/2007 [facility] Patient Information 2014 [place], [place].  _______________________________________________________________________

## 2015-03-19 ENCOUNTER — Encounter (HOSPITAL_COMMUNITY)
Admission: RE | Admit: 2015-03-19 | Discharge: 2015-03-19 | Disposition: A | Discharge status: Home / Self Care | Payer: Medicare Other | Source: Ambulatory Visit | Attending: Orthopedic Surgery | Admitting: Orthopedic Surgery

## 2015-03-19 ENCOUNTER — Encounter (HOSPITAL_COMMUNITY): Payer: Self-pay

## 2015-03-19 DIAGNOSIS — Z01812 Encounter for preprocedural laboratory examination: Secondary | ICD-10-CM | POA: Diagnosis not present

## 2015-03-19 DIAGNOSIS — M1611 Unilateral primary osteoarthritis, right hip: Secondary | ICD-10-CM | POA: Insufficient documentation

## 2015-03-19 DIAGNOSIS — Z0183 Encounter for blood typing: Secondary | ICD-10-CM | POA: Insufficient documentation

## 2015-03-19 HISTORY — DX: Personal history of other malignant neoplasm of skin: Z85.828

## 2015-03-19 LAB — APTT: APTT: 26 s (ref 24–37)

## 2015-03-19 LAB — URINALYSIS, ROUTINE W REFLEX MICROSCOPIC
Bilirubin Urine: NEGATIVE
GLUCOSE, UA: NEGATIVE mg/dL
HGB URINE DIPSTICK: NEGATIVE
KETONES UR: 15 mg/dL — AB
LEUKOCYTES UA: NEGATIVE
Nitrite: NEGATIVE
PH: 5.5 (ref 5.0–8.0)
PROTEIN: NEGATIVE mg/dL
SPECIFIC GRAVITY, URINE: 1.022 (ref 1.005–1.030)

## 2015-03-19 LAB — PROTIME-INR
INR: 1.05 (ref 0.00–1.49)
PROTHROMBIN TIME: 13.9 s (ref 11.6–15.2)

## 2015-03-19 LAB — SURGICAL PCR SCREEN
MRSA, PCR: NEGATIVE
STAPHYLOCOCCUS AUREUS: NEGATIVE

## 2015-03-24 ENCOUNTER — Ambulatory Visit: Payer: Self-pay | Admitting: Orthopedic Surgery

## 2015-03-24 NOTE — Anesthesia Preprocedure Evaluation (Addendum)
Anesthesia Evaluation  Patient identified by MRN, date of birth, ID band Patient awake    Reviewed: Allergy & Precautions, H&P , NPO status , Patient's Chart, lab work & pertinent test results  History of Anesthesia Complications (+) history of anesthetic complications  Airway Mallampati: II  TM Distance: >3 FB Neck ROM: Full    Dental no notable dental hx. (+) Teeth Intact, Dental Advisory Given   Pulmonary neg pulmonary ROS, former smoker,    Pulmonary exam normal breath sounds clear to auscultation       Cardiovascular negative cardio ROS Normal cardiovascular exam Rhythm:Regular Rate:Normal     Neuro/Psych negative neurological ROS  negative psych ROS   GI/Hepatic negative GI ROS, Neg liver ROS,   Endo/Other  negative endocrine ROS  Renal/GU negative Renal ROS  negative genitourinary   Musculoskeletal  (+) Arthritis , Osteoarthritis,    Abdominal   Peds  Hematology negative hematology ROS (+)   Anesthesia Other Findings   Reproductive/Obstetrics negative OB ROS                            Anesthesia Physical Anesthesia Plan  ASA: II  Anesthesia Plan: Spinal   Post-op Pain Management:    Induction:   Airway Management Planned:   Additional Equipment:   Intra-op Plan:   Post-operative Plan:   Informed Consent:   Plan Discussed with: Surgeon  Anesthesia Plan Comments:         Anesthesia Quick Evaluation

## 2015-03-25 ENCOUNTER — Inpatient Hospital Stay (HOSPITAL_COMMUNITY): Payer: Medicare Other

## 2015-03-25 ENCOUNTER — Inpatient Hospital Stay (HOSPITAL_COMMUNITY)
Admission: RE | Admit: 2015-03-25 | Discharge: 2015-03-26 | DRG: 470 | Disposition: A | Discharge status: Home Health | Payer: Medicare Other | Source: Ambulatory Visit | Attending: Orthopedic Surgery | Admitting: Orthopedic Surgery

## 2015-03-25 ENCOUNTER — Encounter (HOSPITAL_COMMUNITY): Payer: Self-pay | Admitting: *Deleted

## 2015-03-25 ENCOUNTER — Encounter (HOSPITAL_COMMUNITY)
Admission: RE | Disposition: A | Discharge status: Home Health | Payer: Self-pay | Source: Ambulatory Visit | Attending: Orthopedic Surgery

## 2015-03-25 ENCOUNTER — Inpatient Hospital Stay (HOSPITAL_COMMUNITY): Payer: Medicare Other | Admitting: Anesthesiology

## 2015-03-25 DIAGNOSIS — K649 Unspecified hemorrhoids: Secondary | ICD-10-CM | POA: Diagnosis present

## 2015-03-25 DIAGNOSIS — Z96649 Presence of unspecified artificial hip joint: Secondary | ICD-10-CM

## 2015-03-25 DIAGNOSIS — Z791 Long term (current) use of non-steroidal anti-inflammatories (NSAID): Secondary | ICD-10-CM | POA: Diagnosis not present

## 2015-03-25 DIAGNOSIS — Z87891 Personal history of nicotine dependence: Secondary | ICD-10-CM

## 2015-03-25 DIAGNOSIS — Z8262 Family history of osteoporosis: Secondary | ICD-10-CM | POA: Diagnosis not present

## 2015-03-25 DIAGNOSIS — M25551 Pain in right hip: Secondary | ICD-10-CM | POA: Diagnosis not present

## 2015-03-25 DIAGNOSIS — Z471 Aftercare following joint replacement surgery: Secondary | ICD-10-CM | POA: Diagnosis not present

## 2015-03-25 DIAGNOSIS — M1611 Unilateral primary osteoarthritis, right hip: Principal | ICD-10-CM | POA: Diagnosis present

## 2015-03-25 DIAGNOSIS — Z85828 Personal history of other malignant neoplasm of skin: Secondary | ICD-10-CM

## 2015-03-25 DIAGNOSIS — M1612 Unilateral primary osteoarthritis, left hip: Secondary | ICD-10-CM | POA: Diagnosis not present

## 2015-03-25 DIAGNOSIS — Z01812 Encounter for preprocedural laboratory examination: Secondary | ICD-10-CM | POA: Diagnosis not present

## 2015-03-25 DIAGNOSIS — Z8619 Personal history of other infectious and parasitic diseases: Secondary | ICD-10-CM | POA: Diagnosis not present

## 2015-03-25 DIAGNOSIS — Z96651 Presence of right artificial knee joint: Secondary | ICD-10-CM | POA: Diagnosis present

## 2015-03-25 DIAGNOSIS — M1711 Unilateral primary osteoarthritis, right knee: Secondary | ICD-10-CM | POA: Diagnosis present

## 2015-03-25 DIAGNOSIS — M169 Osteoarthritis of hip, unspecified: Secondary | ICD-10-CM | POA: Diagnosis present

## 2015-03-25 DIAGNOSIS — Z809 Family history of malignant neoplasm, unspecified: Secondary | ICD-10-CM | POA: Diagnosis not present

## 2015-03-25 DIAGNOSIS — Z96641 Presence of right artificial hip joint: Secondary | ICD-10-CM | POA: Diagnosis not present

## 2015-03-25 HISTORY — PX: TOTAL HIP ARTHROPLASTY: SHX124

## 2015-03-25 LAB — TYPE AND SCREEN
ABO/RH(D): A POS
Antibody Screen: NEGATIVE

## 2015-03-25 SURGERY — ARTHROPLASTY, HIP, TOTAL, ANTERIOR APPROACH
Anesthesia: Spinal | Site: Hip | Laterality: Right

## 2015-03-25 MED ORDER — CEFAZOLIN SODIUM-DEXTROSE 2-3 GM-% IV SOLR
2.0000 g | Freq: Four times a day (QID) | INTRAVENOUS | Status: AC
  Administered 2015-03-25 (×2): 2 g via INTRAVENOUS
  Filled 2015-03-25 (×2): qty 50

## 2015-03-25 MED ORDER — ACETAMINOPHEN 500 MG PO TABS
1000.0000 mg | ORAL_TABLET | Freq: Four times a day (QID) | ORAL | Status: AC
  Administered 2015-03-25 – 2015-03-26 (×3): 1000 mg via ORAL
  Filled 2015-03-25 (×4): qty 2

## 2015-03-25 MED ORDER — POLYETHYLENE GLYCOL 3350 17 G PO PACK: 17.0000 g | PACK | Freq: Every day | ORAL | Status: DC | PRN

## 2015-03-25 MED ORDER — DEXAMETHASONE SODIUM PHOSPHATE 10 MG/ML IJ SOLN
10.0000 mg | Freq: Once | INTRAMUSCULAR | Status: AC
  Administered 2015-03-26: 10 mg via INTRAVENOUS
  Filled 2015-03-25: qty 1

## 2015-03-25 MED ORDER — PHENYLEPHRINE 40 MCG/ML (10ML) SYRINGE FOR IV PUSH (FOR BLOOD PRESSURE SUPPORT)
PREFILLED_SYRINGE | INTRAVENOUS | Status: AC
  Filled 2015-03-25: qty 10

## 2015-03-25 MED ORDER — TRAMADOL HCL 50 MG PO TABS: 50.0000 mg | ORAL_TABLET | Freq: Four times a day (QID) | ORAL | Status: DC | PRN

## 2015-03-25 MED ORDER — DEXAMETHASONE SODIUM PHOSPHATE 10 MG/ML IJ SOLN
INTRAMUSCULAR | Status: AC
Start: 2015-03-25 — End: 2015-03-25
  Filled 2015-03-25: qty 1

## 2015-03-25 MED ORDER — LIDOCAINE HCL (CARDIAC) 20 MG/ML IV SOLN
INTRAVENOUS | Status: DC | PRN
  Administered 2015-03-25: 40 mg via INTRAVENOUS

## 2015-03-25 MED ORDER — MIDAZOLAM HCL 5 MG/5ML IJ SOLN
INTRAMUSCULAR | Status: DC | PRN
  Administered 2015-03-25: 2 mg via INTRAVENOUS

## 2015-03-25 MED ORDER — ACETAMINOPHEN 650 MG RE SUPP: 650.0000 mg | Freq: Four times a day (QID) | RECTAL | Status: DC | PRN

## 2015-03-25 MED ORDER — METOCLOPRAMIDE HCL 5 MG/ML IJ SOLN: 5.0000 mg | Freq: Three times a day (TID) | INTRAMUSCULAR | Status: DC | PRN

## 2015-03-25 MED ORDER — LACTATED RINGERS IV SOLN: INTRAVENOUS | Status: DC

## 2015-03-25 MED ORDER — PROPOFOL 500 MG/50ML IV EMUL
INTRAVENOUS | Status: DC | PRN
  Administered 2015-03-25: 150 ug/kg/min via INTRAVENOUS

## 2015-03-25 MED ORDER — ONDANSETRON HCL 4 MG/2ML IJ SOLN: 4.0000 mg | Freq: Four times a day (QID) | INTRAMUSCULAR | Status: DC | PRN

## 2015-03-25 MED ORDER — DOCUSATE SODIUM 100 MG PO CAPS
100.0000 mg | ORAL_CAPSULE | Freq: Two times a day (BID) | ORAL | Status: DC
  Administered 2015-03-25 – 2015-03-26 (×3): 100 mg via ORAL

## 2015-03-25 MED ORDER — TRANEXAMIC ACID 1000 MG/10ML IV SOLN
1000.0000 mg | Freq: Once | INTRAVENOUS | Status: AC
  Administered 2015-03-25: 1000 mg via INTRAVENOUS
  Filled 2015-03-25: qty 10

## 2015-03-25 MED ORDER — MENTHOL 3 MG MT LOZG: 1.0000 | LOZENGE | OROMUCOSAL | Status: DC | PRN

## 2015-03-25 MED ORDER — DIPHENHYDRAMINE HCL 12.5 MG/5ML PO ELIX: 12.5000 mg | ORAL_SOLUTION | ORAL | Status: DC | PRN

## 2015-03-25 MED ORDER — ACETAMINOPHEN 10 MG/ML IV SOLN
INTRAVENOUS | Status: AC
  Filled 2015-03-25: qty 100

## 2015-03-25 MED ORDER — ACETAMINOPHEN 325 MG PO TABS
650.0000 mg | ORAL_TABLET | Freq: Four times a day (QID) | ORAL | Status: DC | PRN
Start: 2015-03-26 — End: 2015-03-26

## 2015-03-25 MED ORDER — BISACODYL 10 MG RE SUPP: 10.0000 mg | Freq: Every day | RECTAL | Status: DC | PRN

## 2015-03-25 MED ORDER — BUPIVACAINE HCL (PF) 0.25 % IJ SOLN
INTRAMUSCULAR | Status: DC | PRN
  Administered 2015-03-25: 30 mL

## 2015-03-25 MED ORDER — LACTATED RINGERS IV SOLN
INTRAVENOUS | Status: DC | PRN
  Administered 2015-03-25 (×3): via INTRAVENOUS

## 2015-03-25 MED ORDER — SODIUM CHLORIDE 0.9 % IV SOLN
1000.0000 mg | INTRAVENOUS | Status: AC
  Administered 2015-03-25: 1000 mg via INTRAVENOUS
  Filled 2015-03-25: qty 10

## 2015-03-25 MED ORDER — OXYCODONE HCL 5 MG PO TABS
5.0000 mg | ORAL_TABLET | ORAL | Status: DC | PRN
  Administered 2015-03-25: 5 mg via ORAL
  Administered 2015-03-25 – 2015-03-26 (×6): 10 mg via ORAL
  Filled 2015-03-25: qty 1
  Filled 2015-03-25 (×6): qty 2

## 2015-03-25 MED ORDER — CEFAZOLIN SODIUM-DEXTROSE 2-3 GM-% IV SOLR
2.0000 g | INTRAVENOUS | Status: AC
  Administered 2015-03-25: 2 g via INTRAVENOUS

## 2015-03-25 MED ORDER — SODIUM CHLORIDE 0.9 % IV SOLN
INTRAVENOUS | Status: DC
  Administered 2015-03-25 – 2015-03-26 (×2): via INTRAVENOUS

## 2015-03-25 MED ORDER — CHLORHEXIDINE GLUCONATE 4 % EX LIQD
60.0000 mL | Freq: Once | CUTANEOUS | Status: DC
Start: 2015-03-25 — End: 2015-03-25

## 2015-03-25 MED ORDER — MIDAZOLAM HCL 2 MG/2ML IJ SOLN
INTRAMUSCULAR | Status: AC
  Filled 2015-03-25: qty 2

## 2015-03-25 MED ORDER — PHENOL 1.4 % MT LIQD: 1.0000 | OROMUCOSAL | Status: DC | PRN

## 2015-03-25 MED ORDER — HYDROMORPHONE HCL 1 MG/ML IJ SOLN: 0.2500 mg | INTRAMUSCULAR | Status: DC | PRN

## 2015-03-25 MED ORDER — BUPIVACAINE HCL (PF) 0.25 % IJ SOLN
INTRAMUSCULAR | Status: AC
  Filled 2015-03-25: qty 30

## 2015-03-25 MED ORDER — PROPOFOL 10 MG/ML IV BOLUS
INTRAVENOUS | Status: AC
  Filled 2015-03-25: qty 40

## 2015-03-25 MED ORDER — FENTANYL CITRATE (PF) 100 MCG/2ML IJ SOLN
INTRAMUSCULAR | Status: DC | PRN
  Administered 2015-03-25 (×2): 25 ug via INTRAVENOUS
  Administered 2015-03-25: 50 ug via INTRAVENOUS

## 2015-03-25 MED ORDER — PROPOFOL 10 MG/ML IV BOLUS
INTRAVENOUS | Status: DC | PRN
  Administered 2015-03-25: 20 mg via INTRAVENOUS
  Administered 2015-03-25: 30 mg via INTRAVENOUS
  Administered 2015-03-25: 20 mg via INTRAVENOUS

## 2015-03-25 MED ORDER — METOCLOPRAMIDE HCL 10 MG PO TABS: 5.0000 mg | ORAL_TABLET | Freq: Three times a day (TID) | ORAL | Status: DC | PRN

## 2015-03-25 MED ORDER — METHOCARBAMOL 1000 MG/10ML IJ SOLN
500.0000 mg | Freq: Four times a day (QID) | INTRAVENOUS | Status: DC | PRN
  Administered 2015-03-25: 500 mg via INTRAVENOUS
  Filled 2015-03-25 (×2): qty 5

## 2015-03-25 MED ORDER — MORPHINE SULFATE (PF) 2 MG/ML IV SOLN: 1.0000 mg | INTRAVENOUS | Status: DC | PRN

## 2015-03-25 MED ORDER — PHENYLEPHRINE HCL 10 MG/ML IJ SOLN
INTRAMUSCULAR | Status: DC | PRN
  Administered 2015-03-25 (×2): 80 ug via INTRAVENOUS
  Administered 2015-03-25 (×2): 40 ug via INTRAVENOUS
  Administered 2015-03-25 (×3): 80 ug via INTRAVENOUS
  Administered 2015-03-25: 40 ug via INTRAVENOUS

## 2015-03-25 MED ORDER — CEFAZOLIN SODIUM-DEXTROSE 2-3 GM-% IV SOLR
INTRAVENOUS | Status: AC
  Filled 2015-03-25: qty 50

## 2015-03-25 MED ORDER — ACETAMINOPHEN 10 MG/ML IV SOLN
1000.0000 mg | Freq: Once | INTRAVENOUS | Status: AC
  Administered 2015-03-25: 1000 mg via INTRAVENOUS
  Filled 2015-03-25: qty 100

## 2015-03-25 MED ORDER — ONDANSETRON HCL 4 MG/2ML IJ SOLN
INTRAMUSCULAR | Status: AC
  Filled 2015-03-25: qty 2

## 2015-03-25 MED ORDER — PROPOFOL 10 MG/ML IV BOLUS
INTRAVENOUS | Status: AC
  Filled 2015-03-25: qty 20

## 2015-03-25 MED ORDER — BUPIVACAINE IN DEXTROSE 0.75-8.25 % IT SOLN
INTRATHECAL | Status: DC | PRN
  Administered 2015-03-25: 2 mL via INTRATHECAL

## 2015-03-25 MED ORDER — DEXAMETHASONE SODIUM PHOSPHATE 10 MG/ML IJ SOLN
10.0000 mg | Freq: Once | INTRAMUSCULAR | Status: AC
  Administered 2015-03-25: 10 mg via INTRAVENOUS

## 2015-03-25 MED ORDER — FLEET ENEMA 7-19 GM/118ML RE ENEM: 1.0000 | ENEMA | Freq: Once | RECTAL | Status: DC | PRN

## 2015-03-25 MED ORDER — RIVAROXABAN 10 MG PO TABS
10.0000 mg | ORAL_TABLET | Freq: Every day | ORAL | Status: DC
  Administered 2015-03-26: 10 mg via ORAL
  Filled 2015-03-25 (×2): qty 1

## 2015-03-25 MED ORDER — FENTANYL CITRATE (PF) 100 MCG/2ML IJ SOLN
INTRAMUSCULAR | Status: AC
  Filled 2015-03-25: qty 2

## 2015-03-25 MED ORDER — LIDOCAINE HCL (CARDIAC) 20 MG/ML IV SOLN
INTRAVENOUS | Status: AC
  Filled 2015-03-25: qty 5

## 2015-03-25 MED ORDER — SODIUM CHLORIDE 0.9 % IV SOLN
INTRAVENOUS | Status: DC
Start: 2015-03-25 — End: 2015-03-25

## 2015-03-25 MED ORDER — ONDANSETRON HCL 4 MG PO TABS: 4.0000 mg | ORAL_TABLET | Freq: Four times a day (QID) | ORAL | Status: DC | PRN

## 2015-03-25 MED ORDER — ONDANSETRON HCL 4 MG/2ML IJ SOLN
INTRAMUSCULAR | Status: DC | PRN
  Administered 2015-03-25 (×2): 2 mg via INTRAVENOUS

## 2015-03-25 MED ORDER — METHOCARBAMOL 500 MG PO TABS
500.0000 mg | ORAL_TABLET | Freq: Four times a day (QID) | ORAL | Status: DC | PRN
  Administered 2015-03-25: 500 mg via ORAL
  Filled 2015-03-25: qty 1

## 2015-03-25 SURGICAL SUPPLY — 34 items
BAG DECANTER FOR FLEXI CONT (MISCELLANEOUS) IMPLANT
BAG ZIPLOCK 12X15 (MISCELLANEOUS) IMPLANT
BLADE SAG 18X100X1.27 (BLADE) ×2 IMPLANT
CAPT HIP TOTAL 2 ×2 IMPLANT
CLOSURE STERI-STRIP 1/4X4 (GAUZE/BANDAGES/DRESSINGS) ×2 IMPLANT
CLOTH BEACON ORANGE TIMEOUT ST (SAFETY) ×2 IMPLANT
COVER PERINEAL POST (MISCELLANEOUS) ×2 IMPLANT
DECANTER SPIKE VIAL GLASS SM (MISCELLANEOUS) ×2 IMPLANT
DRAPE STERI IOBAN 125X83 (DRAPES) ×2 IMPLANT
DRAPE U-SHAPE 47X51 STRL (DRAPES) ×4 IMPLANT
DRSG ADAPTIC 3X8 NADH LF (GAUZE/BANDAGES/DRESSINGS) ×2 IMPLANT
DRSG MEPILEX BORDER 4X4 (GAUZE/BANDAGES/DRESSINGS) ×2 IMPLANT
DRSG MEPILEX BORDER 4X8 (GAUZE/BANDAGES/DRESSINGS) ×2 IMPLANT
DURAPREP 26ML APPLICATOR (WOUND CARE) ×2 IMPLANT
ELECT REM PT RETURN 9FT ADLT (ELECTROSURGICAL) ×2
ELECTRODE REM PT RTRN 9FT ADLT (ELECTROSURGICAL) ×1 IMPLANT
EVACUATOR 1/8 PVC DRAIN (DRAIN) ×2 IMPLANT
GLOVE BIO SURGEON STRL SZ7.5 (GLOVE) ×2 IMPLANT
GLOVE BIO SURGEON STRL SZ8 (GLOVE) ×4 IMPLANT
GLOVE BIOGEL PI IND STRL 8 (GLOVE) ×2 IMPLANT
GLOVE BIOGEL PI INDICATOR 8 (GLOVE) ×2
GOWN STRL REUS W/TWL LRG LVL3 (GOWN DISPOSABLE) ×2 IMPLANT
GOWN STRL REUS W/TWL XL LVL3 (GOWN DISPOSABLE) ×2 IMPLANT
PACK ANTERIOR HIP CUSTOM (KITS) ×2 IMPLANT
STRIP CLOSURE SKIN 1/2X4 (GAUZE/BANDAGES/DRESSINGS) ×2 IMPLANT
SUT ETHIBOND NAB CT1 #1 30IN (SUTURE) ×2 IMPLANT
SUT MNCRL AB 4-0 PS2 18 (SUTURE) ×2 IMPLANT
SUT VIC AB 2-0 CT1 27 (SUTURE) ×2
SUT VIC AB 2-0 CT1 TAPERPNT 27 (SUTURE) ×2 IMPLANT
SUT VLOC 180 0 24IN GS25 (SUTURE) ×2 IMPLANT
SYR 50ML LL SCALE MARK (SYRINGE) IMPLANT
TRAY FOLEY W/METER SILVER 14FR (SET/KITS/TRAYS/PACK) ×2 IMPLANT
TRAY FOLEY W/METER SILVER 16FR (SET/KITS/TRAYS/PACK) ×2 IMPLANT
YANKAUER SUCT BULB TIP 10FT TU (MISCELLANEOUS) ×2 IMPLANT

## 2015-03-25 NOTE — Evaluation (Signed)
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  Pt will benefit from skilled PT to increase their independence and safety with mobility to allow discharge to the venue listed below.  Pt ambulated short distance POD #0 and plans to return home with assist from sister.     Follow Up Recommendations Home health PT    Equipment Recommendations  None recommended by PT    Recommendations for Other Services       Precautions / Restrictions Precautions Precautions: Fall Restrictions Weight Bearing Restrictions: No Other Position/Activity Restrictions: WBAT      Mobility  Bed Mobility Overal bed mobility: Needs Assistance Bed Mobility: Supine to Sit     Supine to sit: Min assist     General bed mobility comments: verbal cues for technique, assist for R LE  Transfers Overall transfer level: Needs assistance Equipment used: Rolling walker (2 wheeled) Transfers: Sit to/from Stand Sit to Stand: Min assist         General transfer comment: verbal cues for UE and LE positioning  Ambulation/Gait Ambulation/Gait assistance: Min guard Ambulation Distance (Feet): 30 Feet Assistive device: Rolling walker (2 wheeled) Gait Pattern/deviations: Step-to pattern;Antalgic;Trunk flexed     General Gait Details: verbal cues for sequence, RW positioning, posture  Stairs            Wheelchair Mobility    Modified Rankin (Stroke Patients Only)       Balance                                             Pertinent Vitals/Pain Pain Assessment: No/denies pain    Home Living Family/patient expects to be discharged to:: Private residence Living Arrangements: Spouse/significant other Available Help at  Discharge: Family (sister to assist upon d/c) Type of Home: House Home Access: Ramped entrance     Home Layout: Able to live on main level with bedroom/bathroom Home Equipment: Walker - 2 wheels      Prior Function Level of Independence: Independent with assistive device(s)         Comments: using RW due to pain     Hand Dominance        Extremity/Trunk Assessment               Lower Extremity Assessment: RLE deficits/detail RLE Deficits / Details: assist for R LE required, observed decreased hip strength functionally       Communication   Communication: No difficulties  Cognition Arousal/Alertness: Awake/alert Behavior During Therapy: WFL for tasks assessed/performed Overall Cognitive Status: Within Functional Limits for tasks assessed                      General Comments      Exercises        Assessment/Plan    PT Assessment Patient needs continued PT services  PT Diagnosis Difficulty walking   PT Problem List Decreased strength;Decreased mobility;Decreased knowledge of use of DME;Pain  PT Treatment Interventions Functional mobility training;Gait training;DME instruction;Patient/family education;Therapeutic exercise;Therapeutic activities   PT Goals (Current goals can be found in the Care Plan section)  Acute Rehab PT Goals PT Goal Formulation: With patient Time For Goal Achievement: 03/28/15 Potential to Achieve Goals: Good    Frequency 7X/week   Barriers to discharge        Co-evaluation               End of Session Equipment Utilized During Treatment: Gait belt Activity Tolerance: Patient tolerated treatment well Patient left: in chair;with call bell/phone within reach;with chair alarm set;with family/visitor present Nurse Communication: Mobility status         Time: [id number] PT Time Calculation (min) (ACUTE ONLY): 18 min   Charges:   PT Evaluation $PT Eval Low Complexity: 1 Procedure     PT G Codes:         Johnette Teigen,KATHrine E 03/25/2015, 3:55 PM Carmelia Bake, PT, DPT 03/25/2015 Pager: [phone]

## 2015-03-25 NOTE — Transfer of Care (Signed)
Immediate Anesthesia Transfer of Care Note  Patient: Jamie Galloway  Procedure(s) Performed: Procedure(s): RIGHT TOTAL HIP ARTHROPLASTY ANTERIOR APPROACH (Right)  Patient Location: PACU  Anesthesia Type:Spinal  Level of Consciousness:  sedated, patient cooperative and responds to stimulation  Airway & Oxygen Therapy:Patient Spontanous Breathing and Patient connected to face mask oxgen  Post-op Assessment:  Report given to PACU RN and Post -op Vital signs reviewed and stable  Post vital signs:  Reviewed and stable  Last Vitals:  Filed Vitals:   03/25/15 0611 03/25/15 1026  BP: 154/89 129/61  Pulse: 85   Temp: 37.4 C 36.7 C  Resp: 16     Complications: No apparent anesthesia complications

## 2015-03-25 NOTE — Anesthesia Procedure Notes (Signed)
Spinal Patient location during procedure: OR Start time: 03/25/2015 8:24 AM End time: 03/25/2015 8:34 AM Staffing Anesthesiologist: Rod Mae Performed by: anesthesiologist  Preanesthetic Checklist Completed: patient identified, site marked, surgical consent, pre-op evaluation, timeout performed, IV checked, risks and benefits discussed and monitors and equipment checked Spinal Block Patient position: sitting Prep: Betadine Patient monitoring: heart rate, continuous pulse ox and blood pressure Approach: right paramedian Location: L3-4 Injection technique: single-shot Needle Needle type: Quincke  Needle gauge: 22 G Needle length: 9 cm Assessment Sensory level: T6 Additional Notes Expiration date of kit checked and confirmed. Patient tolerated procedure well, without complications.

## 2015-03-25 NOTE — Anesthesia Postprocedure Evaluation (Signed)
Anesthesia Post Note  Patient: Nyha Pado Kerby  Procedure(s) Performed: Procedure(s) (LRB): RIGHT TOTAL HIP ARTHROPLASTY ANTERIOR APPROACH (Right)  Patient location during evaluation: PACU Anesthesia Type: General Level of consciousness: awake and alert Pain management: pain level controlled Vital Signs Assessment: post-procedure vital signs reviewed and stable Respiratory status: spontaneous breathing, nonlabored ventilation, respiratory function stable and patient connected to nasal cannula oxygen Cardiovascular status: blood pressure returned to baseline and stable Postop Assessment: no signs of nausea or vomiting Anesthetic complications: no    Last Vitals:  Filed Vitals:   03/25/15 1200 03/25/15 1215  BP: 149/71 143/71  Pulse: 57 57  Temp:    Resp: 13 15    Last Pain:  Filed Vitals:   03/25/15 1216  PainSc: 0-No pain                 Kaileb Monsanto L

## 2015-03-25 NOTE — Op Note (Signed)
OPERATIVE REPORT  PREOPERATIVE DIAGNOSIS: Osteoarthritis of the Right hip.   POSTOPERATIVE DIAGNOSIS: Osteoarthritis of the Right  hip.   PROCEDURE: Right total hip arthroplasty, anterior approach.   SURGEON: Gaynelle Arabian, MD   ASSISTANT: Arlee Muslim, PA-C  ANESTHESIA:  Spinal  ESTIMATED BLOOD LOSS:-300 ml   DRAINS: Hemovac x1.   COMPLICATIONS: None   CONDITION: PACU - hemodynamically stable.   BRIEF CLINICAL NOTE: Jamie Galloway is a 75 y.o. female who has advanced end-  stage arthritis of her Right  hip with progressively worsening pain and  dysfunction.The patient has failed nonoperative management and presents for  total hip arthroplasty.   PROCEDURE IN DETAIL: After successful administration of spinal  anesthetic, the traction boots for the [facility] bed were placed on both  feet and the patient was placed onto the [facility] bed, boots placed into the leg  holders. The Right hip was then isolated from the perineum with plastic  drapes and prepped and draped in the usual sterile fashion. ASIS and  greater trochanter were marked and a oblique incision was made, starting  at about 1 cm lateral and 2 cm distal to the ASIS and coursing towards  the anterior cortex of the femur. The skin was cut with a 10 blade  through subcutaneous tissue to the level of the fascia overlying the  tensor fascia lata muscle. The fascia was then incised in line with the  incision at the junction of the anterior third and posterior 2/3rd. The  muscle was teased off the fascia and then the interval between the TFL  and the rectus was developed. The Hohmann retractor was then placed at  the top of the femoral neck over the capsule. The vessels overlying the  capsule were cauterized and the fat on top of the capsule was removed.  A Hohmann retractor was then placed anterior underneath the rectus  femoris to give exposure to the entire anterior capsule. A T-shaped  capsulotomy was performed. The  edges were tagged and the femoral head  was identified.       Osteophytes are removed off the superior acetabulum.  The femoral neck was then cut in situ with an oscillating saw. Traction  was then applied to the left lower extremity utilizing the [facility]  traction. The femoral head was then removed. Retractors were placed  around the acetabulum and then circumferential removal of the labrum was  performed. Osteophytes were also removed. Reaming starts at 45 mm to  medialize and  Increased in 2 mm increments to 49 mm. We reamed in  approximately 40 degrees of abduction, 20 degrees anteversion. A 50 mm  pinnacle acetabular shell was then impacted in anatomic position under  fluoroscopic guidance with excellent purchase. We did not need to place  any additional dome screws. A 44mm neutral + 4 marathon liner was then  placed into the acetabular shell.       The femoral lift was then placed along the lateral aspect of the femur  just distal to the vastus ridge. The leg was  externally rotated and capsule  was stripped off the inferior aspect of the femoral neck down to the  level of the lesser trochanter, this was done with electrocautery. The femur was lifted after this was performed. The  leg was then placed and extended in adducted position to essentially delivering the femur. We also removed the capsule superiorly and the  piriformis from the piriformis fossa  to gain excellent exposure of the  proximal femur. Rongeur was used to remove some cancellous bone to get  into the lateral portion of the proximal femur for placement of the  initial starter reamer. The starter broaches was placed  the starter broach  and was shown to go down the center of the canal. Broaching  with the  Corail system was then performed starting at size 8, coursing  Up to size 14. A size 14 had excellent torsional and rotational  and axial stability. The trial standard offset neck was then placed  with a 32 + 1 trial  head. The hip was then reduced. We confirmed that  the stem was in the canal both on AP and lateral x-rays. It also has excellent sizing. The hip was reduced with outstanding stability through full extension, full external rotation,  and then flexion in adduction internal rotation. AP pelvis was taken  and the leg lengths were measured and found to be exactly equal. Hip  was then dislocated again and the femoral head and neck removed. The  femoral broach was removed. Size 14 Corail stem with a standard offset  neck was then impacted into the femur following native anteversion. Has  excellent purchase in the canal. Excellent torsional and rotational and  axial stability. It is confirmed to be in the canal on AP and lateral  fluoroscopic views. The 32 + 1 ceramic head was placed and the hip  reduced with outstanding stability. Again AP pelvis was taken and it  confirmed that the leg lengths were equal. The wound was then copiously  irrigated with saline solution and the capsule reattached and repaired  with Ethibond suture. 30 ml of .25% Bupivicaine injected into the capsule and into the edge of the tensor fascia lata as well as subcutaneous tissue. The fascia overlying the tensor fascia lata was  then closed with a running #1 V-Loc. Subcu was closed with interrupted  2-0 Vicryl and subcuticular running 4-0 Monocryl. Incision was cleaned  and dried. Steri-Strips and a bulky sterile dressing applied. Hemovac  drain was hooked to suction and then he was awakened and transported to  recovery in stable condition.        Please note that a surgical assistant was a medical necessity for this procedure to perform it in a safe and expeditious manner. Assistant was necessary to provide appropriate retraction of vital neurovascular structures and to prevent femoral fracture and allow for anatomic placement of the prosthesis.  Gaynelle Arabian, M.D.

## 2015-03-25 NOTE — Progress Notes (Signed)
Utilization review completed.  

## 2015-03-25 NOTE — Interval H&P Note (Signed)
History and Physical Interval Note:  03/25/2015 7:45 AM  Jamie Galloway  has presented today for surgery, with the diagnosis of right hip osteoarthritis  The various methods of treatment have been discussed with the patient and family. After consideration of risks, benefits and other options for treatment, the patient has consented to  Procedure(s): RIGHT TOTAL HIP ARTHROPLASTY ANTERIOR APPROACH (Right) as a surgical intervention .  The patient's history has been reviewed, patient examined, no change in status, stable for surgery.  I have reviewed the patient's chart and labs.  Questions were answered to the patient's satisfaction.     Gearlean Alf

## 2015-03-26 ENCOUNTER — Encounter (HOSPITAL_COMMUNITY): Payer: Self-pay | Admitting: Orthopedic Surgery

## 2015-03-26 LAB — CBC
HCT: 34.9 % — ABNORMAL LOW (ref 36.0–46.0)
Hemoglobin: 12.2 g/dL (ref 12.0–15.0)
MCH: 31.4 pg (ref 26.0–34.0)
MCHC: 35 g/dL (ref 30.0–36.0)
MCV: 89.9 fL (ref 78.0–100.0)
Platelets: 269 10*3/uL (ref 150–400)
RBC: 3.88 MIL/uL (ref 3.87–5.11)
RDW: 12.9 % (ref 11.5–15.5)
WBC: 11.3 10*3/uL — ABNORMAL HIGH (ref 4.0–10.5)

## 2015-03-26 LAB — BASIC METABOLIC PANEL
Anion gap: 7 (ref 5–15)
BUN: 12 mg/dL (ref 6–20)
CALCIUM: 8.3 mg/dL — AB (ref 8.9–10.3)
CO2: 25 mmol/L (ref 22–32)
CREATININE: 0.59 mg/dL (ref 0.44–1.00)
Chloride: 108 mmol/L (ref 101–111)
GFR calc non Af Amer: >60 mL/min (ref >60)
Glucose, Bld: 123 mg/dL — ABNORMAL HIGH (ref 65–99)
Potassium: 4 mmol/L (ref 3.5–5.1)
Sodium: 140 mmol/L (ref 135–145)

## 2015-03-26 MED ORDER — METHOCARBAMOL 500 MG PO TABS: 500.0000 mg | ORAL_TABLET | Freq: Four times a day (QID) | ORAL | Status: DC | PRN

## 2015-03-26 MED ORDER — RIVAROXABAN 10 MG PO TABS: 10.0000 mg | ORAL_TABLET | Freq: Every day | ORAL | Status: DC

## 2015-03-26 MED ORDER — TRAMADOL HCL 50 MG PO TABS: 50.0000 mg | ORAL_TABLET | Freq: Four times a day (QID) | ORAL | Status: DC | PRN

## 2015-03-26 MED ORDER — OXYCODONE HCL 5 MG PO TABS: 5.0000 mg | ORAL_TABLET | ORAL | Status: DC | PRN

## 2015-03-26 NOTE — Progress Notes (Signed)
Physical Therapy Treatment Patient Details Name: Jamie Galloway MRN: [id number] DOB: 1940/09/01 Today's Date: 03/26/2015    History of Present Illness Pt is a 75 year old female s/p R DA THA with hx of R TKA    PT Comments    Pt ambulated in hallway and performed LE exercises.    Follow Up Recommendations  Home health PT     Equipment Recommendations  None recommended by PT    Recommendations for Other Services       Precautions / Restrictions Precautions Precautions: Fall Restrictions Other Position/Activity Restrictions: WBAT    Mobility  Bed Mobility Overal bed mobility: Needs Assistance Bed Mobility: Supine to Sit     Supine to sit: Min guard     General bed mobility comments: verbal cues for self assist  Transfers Overall transfer level: Needs assistance Equipment used: Rolling walker (2 wheeled) Transfers: Sit to/from Stand Sit to Stand: Min guard         General transfer comment: verbal cues for UE and LE positioning  Ambulation/Gait Ambulation/Gait assistance: Min guard Ambulation Distance (Feet): 160 Feet Assistive device: Rolling walker (2 wheeled) Gait Pattern/deviations: Step-to pattern;Antalgic;Trunk flexed     General Gait Details: verbal cues for sequence, RW positioning, posture   Stairs            Wheelchair Mobility    Modified Rankin (Stroke Patients Only)       Balance                                    Cognition Arousal/Alertness: Awake/alert Behavior During Therapy: WFL for tasks assessed/performed Overall Cognitive Status: Within Functional Limits for tasks assessed                      Exercises Total Joint Exercises Ankle Circles/Pumps: AROM;Both;10 reps Quad Sets: AROM;Both;10 reps Heel Slides: AAROM;Right;10 reps Hip ABduction/ADduction: Standing;AROM;Right;10 reps (all standing exercises with bil UE support) Long Arc Quad: AROM;Right;10 reps;Seated Marching in Standing:  AROM;Right;10 reps;Standing Standing Hip Extension: AROM;Standing;Right;10 reps    General Comments        Pertinent Vitals/Pain Pain Assessment: 0-10 Pain Score: 3  Pain Location: R hip Pain Descriptors / Indicators: Sore Pain Intervention(s): Limited activity within patient's tolerance;Monitored during session;Repositioned;Ice applied    Home Living Family/patient expects to be discharged to:: Private residence Living Arrangements: Spouse/significant other Available Help at Discharge: Family Type of Home: House Home Access: [place]: Able to live on main level with bedroom/bathroom Home Equipment: [place] - 2 wheels;Shower seat      Prior Function Level of Independence: Independent with assistive device(s)      Comments: using RW due to pain   PT Goals (current goals can now be found in the care plan section) Acute Rehab PT Goals Patient Stated Goal: to go home today Progress towards PT goals: Progressing toward goals    Frequency  7X/week    PT Plan Current plan remains appropriate    Co-evaluation             End of Session Equipment Utilized During Treatment: Gait belt Activity Tolerance: Patient tolerated treatment well Patient left: in chair;with call bell/phone within reach;with chair alarm set;with family/visitor present     Time: [id number] PT Time Calculation (min) (ACUTE ONLY): 21 min  Charges:  $Gait Training: 8-22 mins  G Codes:      Yukari Flax,KATHrine E 2015/04/06, 1:53 PM Carmelia Bake, PT, DPT 2015/04/06 Pager: [phone]

## 2015-03-26 NOTE — Care Management Note (Signed)
Case Management Note  Patient Details  Name: Jamie Galloway MRN: [id number] Date of Birth: 06-Jun-1940  Subjective/Objective:                   RIGHT TOTAL HIP ARTHROPLASTY ANTERIOR APPROACH (Right) Action/Plan: Discharge planning Expected Discharge Date:  03/26/15               Expected Discharge Plan:  [place]  In-House Referral:     Discharge planning Services  CM Consult  Post Acute Care Choice:  Home Health Choice offered to:  Patient  DME Arranged:  N/A DME Agency:  NA  HH Arranged:  PT HH Agency:  [facility]  Status of Service:  Completed, signed off  Medicare Important Message Given:    Date Medicare IM Given:    Medicare IM give by:    Date Additional Medicare IM Given:    Additional Medicare Important Message give by:     If discussed at [place] of Stay Meetings, dates discussed:    Additional Comments: CM met with pt in room to offer choice of home health agency.  Pt chooses [facility] to render HHPT.  Referral given to [facility] rep, Tim (on unit).  Pt has both rolling walker and 3n1 from previous surgery.  No other CM needs were communicated. Dellie Catholic, RN 03/26/2015, 11:34 AM

## 2015-03-26 NOTE — Progress Notes (Signed)
   Subjective: 1 Day Post-Op Procedure(s) (LRB): RIGHT TOTAL HIP ARTHROPLASTY ANTERIOR APPROACH (Right) Patient reports pain as mild.   Patient seen in rounds with Dr. Wynelle Link. Patient is well, and has had no acute complaints or problems We will start therapy today.  If they do well with therapy and meets all goals, then will allow home later this afternoon following therapy. Plan is to go Home after hospital stay.  Objective: Vital signs in last 24 hours: Temp:  [97.3 F (36.3 C)-98.5 F (36.9 C)] 98.3 F (36.8 C) (03/09 0619) Pulse Rate:  [52-76] 67 (03/09 0619) Resp:  [13-24] 16 (03/09 0619) BP: (103-157)/(59-78) 109/65 mmHg (03/09 0619) SpO2:  [90 %-100 %] 96 % (03/09 0619)  Intake/Output from previous day:  Intake/Output Summary (Last 24 hours) at 03/26/15 0730 Last data filed at 03/26/15 0645  Gross per 24 hour  Intake 4472.5 ml  Output   2760 ml  Net 1712.5 ml    Intake/Output this shift:    Labs:  Recent Labs  03/26/15 0349  HGB 12.2    Recent Labs  03/26/15 0349  WBC 11.3*  RBC 3.88  HCT 34.9*  PLT 269    Recent Labs  03/26/15 0349  NA 140  K 4.0  CL 108  CO2 25  BUN 12  CREATININE 0.59  GLUCOSE 123*  CALCIUM 8.3*   No results for input(s): LABPT, INR in the last 72 hours.  EXAM General - Patient is Alert and Appropriate Extremity - Neurovascular intact Sensation intact distally Dorsiflexion/Plantar flexion intact Dressing - no drainage Motor Function - intact, moving foot and toes well on exam.  Hemovac pulled without difficulty.  Past Medical History  Diagnosis Date  . Hemorrhoids     JUST OCCAS BLOOD - NO PROBLEMS AT PRESENT  . History of shingles     ABOUT 2012 - NO RESIDUAL PROBLEMS  . Arthritis     PAIN AND OA RIGHT KNEE  . History of skin cancer     Assessment/Plan: 1 Day Post-Op Procedure(s) (LRB): RIGHT TOTAL HIP ARTHROPLASTY ANTERIOR APPROACH (Right) Principal Problem:   OA (osteoarthritis) of hip  Estimated  body mass index is 27.79 kg/(m^2) as calculated from the following:   Height as of this encounter: 5\' 2"  (1.575 m).   Weight as of this encounter: 68.947 kg (152 lb). Advance diet Discharge home with home health  DVT Prophylaxis - Xarelto Weight-Bearing as tolerated to right leg D/C O2 and Pulse OX and try on Room Air  If meets goals and able to go home: Advance diet Discharge home with home health Diet - Regular diet Follow up - in two weeks Activity - WBAT Disposition - Home Condition Upon Discharge - Stable D/C Meds - See DC Summary DVT Prophylaxis - [place], PA-C Orthopaedic Surgery

## 2015-03-26 NOTE — Progress Notes (Signed)
Occupational Therapy Evaluation Patient Details Name: Jamie Galloway MRN: [id number] DOB: [birth year] Today's Date: 03/26/2015    History of Present Illness Pt is a 75 year old female s/p R DA THA with hx of R TKA   Clinical Impression   All OT education completed and pt questions answered. No further OT needs; will sign off.    Follow Up Recommendations  No OT follow up;Supervision/Assistance - 24 hour    Equipment Recommendations  None recommended by OT    Recommendations for Other Services       Precautions / Restrictions Precautions Precautions: Fall Restrictions Other Position/Activity Restrictions: WBAT      Mobility Bed Mobility               General bed mobility comments: NT -- OOB in recliner  Transfers Overall transfer level: Needs assistance Equipment used: Rolling walker (2 wheeled) Transfers: Sit to/from Stand Sit to Stand: Supervision              Balance                                            ADL Overall ADL's : Needs assistance/impaired Eating/Feeding: Independent;Sitting   Grooming: Wash/dry hands;Wash/dry face;Oral care;Supervision/safety;Standing   Upper Body Bathing: Supervision/ safety;Standing   Lower Body Bathing: Minimal assistance;Sit to/from stand   Upper Body Dressing : Set up;Sitting   Lower Body Dressing: Minimal assistance;Sit to/from stand   Toilet Transfer: Supervision/safety;Ambulation;BSC;RW   Toileting- Clothing Manipulation and Hygiene: Supervision/safety;Sit to/from stand       Functional mobility during ADLs: Supervision/safety;Rolling walker General ADL Comments: Patient stated she needed to toilet. Patient supervision with toileting task, 3 in 1 over regular toilet in bathroom. Used RW to ambulate to bathroom. After toileting, patient stood at sink for grooming, bathing. Sister to assist with donning a new gown. Patient educated on use of grocery bag/trash bag to assist with donning  TED hose. Patient recalls technique to get in/out of walk-in shower and denies need to practice.     Vision     Perception     Praxis      Pertinent Vitals/Pain Pain Assessment: No/denies pain     Hand Dominance Right   Extremity/Trunk Assessment Upper Extremity Assessment Upper Extremity Assessment: Overall WFL for tasks assessed   Lower Extremity Assessment Lower Extremity Assessment: Defer to PT evaluation       Communication Communication Communication: No difficulties   Cognition Arousal/Alertness: Awake/alert Behavior During Therapy: WFL for tasks assessed/performed Overall Cognitive Status: Within Functional Limits for tasks assessed                     General Comments       Exercises       Shoulder Instructions      Home Living Family/patient expects to be discharged to:: Private residence Living Arrangements: Spouse/significant other Available Help at Discharge: Family Type of Home: House Home Access: Ramped entrance     Home Layout: Able to live on main level with bedroom/bathroom     Bathroom Shower/Tub: Occupational psychologist: Standard Bathroom Accessibility: Yes How Accessible: Accessible via walker Home Equipment: [place] - 2 wheels;Shower seat          Prior Functioning/Environment Level of Independence: Independent with assistive device(s)        Comments: using RW due to pain  OT Diagnosis: Acute pain   OT Problem List: Decreased strength;Decreased activity tolerance;Decreased knowledge of use of DME or AE;Pain   OT Treatment/Interventions:      OT Goals(Current goals can be found in the care plan section) Acute Rehab OT Goals Patient Stated Goal: to go home today OT Goal Formulation: All assessment and education complete, DC therapy  OT Frequency:     Barriers to D/C:            Co-evaluation              End of Session Equipment Utilized During Treatment: Rolling walker Nurse  Communication: Other (comment) (pt bathing in bathroom with sister's assistance)  Activity Tolerance: Patient tolerated treatment well Patient left:     Time: [id number] OT Time Calculation (min): 14 min Charges:  OT General Charges $OT Visit: 1 Procedure OT Evaluation $OT Eval Low Complexity: 1 Procedure G-Codes:    Kahle Mcqueen A 04/25/2015, 11:47 AM

## 2015-03-26 NOTE — Discharge Instructions (Addendum)
° °Dr. Frank Aluisio °Total Joint Specialist °[facility] °[address] °[place], [place] [address] °[phone] ° °ANTERIOR APPROACH TOTAL HIP REPLACEMENT POSTOPERATIVE DIRECTIONS ° ° °Hip Rehabilitation, Guidelines Following Surgery  °The results of a hip operation are greatly improved after range of motion and muscle strengthening exercises. Follow all safety measures which are given to protect your hip. If any of these exercises cause increased pain or swelling in your joint, decrease the amount until you are comfortable again. Then slowly increase the exercises. Call your caregiver if you have problems or questions.  ° °HOME CARE INSTRUCTIONS  °Remove items at home which could result in a fall. This includes throw rugs or furniture in walking pathways.  °· ICE to the affected hip every three hours for 30 minutes at a time and then as needed for pain and swelling.  Continue to use ice on the hip for pain and swelling from surgery. You may notice swelling that will progress down to the foot and ankle.  This is normal after surgery.  Elevate the leg when you are not up walking on it.   °· Continue to use the breathing machine which will help keep your temperature down.  It is common for your temperature to cycle up and down following surgery, especially at night when you are not up moving around and exerting yourself.  The breathing machine keeps your lungs expanded and your temperature down. ° ° °DIET °You may resume your previous home diet once your are discharged from the hospital. ° °DRESSING / WOUND CARE / SHOWERING °You may shower 3 days after surgery, but keep the wounds dry during showering.  You may use an occlusive plastic wrap (Press'n Seal for example), NO SOAKING/SUBMERGING IN THE BATHTUB.  If the bandage gets wet, change with a clean dry gauze.  If the incision gets wet, pat the wound dry with a clean towel. °You may start showering once you are discharged home but do not  submerge the incision under water. Just pat the incision dry and apply a dry gauze dressing on daily. °Change the surgical dressing daily and reapply a dry dressing each time. ° °ACTIVITY °Walk with your walker as instructed. °Use walker as long as suggested by your caregivers. °Avoid periods of inactivity such as sitting longer than an hour when not asleep. This helps prevent blood clots.  °You may resume a sexual relationship in one month or when given the OK by your doctor.  °You may return to work once you are cleared by your doctor.  °Do not drive a car for 6 weeks or until released by you surgeon.  °Do not drive while taking narcotics. ° °WEIGHT BEARING °Weight bearing as tolerated with assist device (walker, cane, etc) as directed, use it as long as suggested by your surgeon or therapist, typically at least 4-6 weeks. ° °POSTOPERATIVE CONSTIPATION PROTOCOL °Constipation - defined medically as fewer than three stools per week and severe constipation as less than one stool per week. ° °One of the most common issues patients have following surgery is constipation.  Even if you have a regular bowel pattern at home, your normal regimen is likely to be disrupted due to multiple reasons following surgery.  Combination of anesthesia, postoperative narcotics, change in appetite and fluid intake all can affect your bowels.  In order to avoid complications following surgery, here are some recommendations in order to help you during your recovery period. ° °Colace (docusate) - Pick up an over-the-counter   form of Colace or another stool softener and take twice a day as long as you are requiring postoperative pain medications.  Take with a full glass of water daily.  If you experience loose stools or diarrhea, hold the colace until you stool forms back up.  If your symptoms do not get better within 1 week or if they get worse, check with your doctor. ° °Dulcolax (bisacodyl) - Pick up over-the-counter and take as directed  by the product packaging as needed to assist with the movement of your bowels.  Take with a full glass of water.  Use this product as needed if not relieved by Colace only.  ° °MiraLax (polyethylene glycol) - Pick up over-the-counter to have on hand.  MiraLax is a solution that will increase the amount of water in your bowels to assist with bowel movements.  Take as directed and can mix with a glass of water, juice, soda, coffee, or tea.  Take if you go more than two days without a movement. °Do not use MiraLax more than once per day. Call your doctor if you are still constipated or irregular after using this medication for 7 days in a row. ° °If you continue to have problems with postoperative constipation, please contact the office for further assistance and recommendations.  If you experience "the worst abdominal pain ever" or develop nausea or vomiting, please contact the office immediatly for further recommendations for treatment. ° °ITCHING ° If you experience itching with your medications, try taking only a single pain pill, or even half a pain pill at a time.  You can also use Benadryl over the counter for itching or also to help with sleep.  ° °TED HOSE STOCKINGS °Wear the elastic stockings on both legs for three weeks following surgery during the day but you may remove then at night for sleeping. ° °MEDICATIONS °See your medication summary on the “After Visit Summary” that the nursing staff will review with you prior to discharge.  You may have some home medications which will be placed on hold until you complete the course of blood thinner medication.  It is important for you to complete the blood thinner medication as prescribed by your surgeon.  Continue your approved medications as instructed at time of discharge. ° °PRECAUTIONS °If you experience chest pain or shortness of breath - call 911 immediately for transfer to the hospital emergency department.  °If you develop a fever greater that 101 F,  purulent drainage from wound, increased redness or drainage from wound, foul odor from the wound/dressing, or calf pain - CONTACT YOUR SURGEON.   °                                                °FOLLOW-UP APPOINTMENTS °Make sure you keep all of your appointments after your operation with your surgeon and caregivers. You should call the office at the above phone number and make an appointment for approximately two weeks after the date of your surgery or on the date instructed by your surgeon outlined in the "After Visit Summary". ° °RANGE OF MOTION AND STRENGTHENING EXERCISES  °These exercises are designed to help you keep full movement of your hip joint. Follow your caregiver's or physical therapist's instructions. Perform all exercises about fifteen times, three times per day or as directed. Exercise both hips, even if you   have had only one joint replacement. These exercises can be done on a training (exercise) mat, on the floor, on a table or on a bed. Use whatever works the best and is most comfortable for you. Use music or television while you are exercising so that the exercises are a pleasant break in your day. This will make your life better with the exercises acting as a break in routine you can look forward to.  °Lying on your back, slowly slide your foot toward your buttocks, raising your knee up off the floor. Then slowly slide your foot back down until your leg is straight again.  °Lying on your back spread your legs as far apart as you can without causing discomfort.  °Lying on your side, raise your upper leg and foot straight up from the floor as far as is comfortable. Slowly lower the leg and repeat.  °Lying on your back, tighten up the muscle in the front of your thigh (quadriceps muscles). You can do this by keeping your leg straight and trying to raise your heel off the floor. This helps strengthen the largest muscle supporting your knee.  °Lying on your back, tighten up the muscles of your  buttocks both with the legs straight and with the knee bent at a comfortable angle while keeping your heel on the floor.  ° °IF YOU ARE TRANSFERRED TO A SKILLED REHAB FACILITY °If the patient is transferred to a skilled rehab facility following release from the hospital, a list of the current medications will be sent to the facility for the patient to continue.  When discharged from the skilled rehab facility, please have the facility set up the patient's Home Health Physical Therapy prior to being released. Also, the skilled facility will be responsible for providing the patient with their medications at time of release from the facility to include their pain medication, the muscle relaxants, and their blood thinner medication. If the patient is still at the rehab facility at time of the two week follow up appointment, the skilled rehab facility will also need to assist the patient in arranging follow up appointment in our office and any transportation needs. ° °MAKE SURE YOU:  °Understand these instructions.  °Get help right away if you are not doing well or get worse.  ° ° °Pick up stool softner and laxative for home use following surgery while on pain medications. °Do not submerge incision under water. °Please use good hand washing techniques while changing dressing each day. °May shower starting three days after surgery. °Please use a clean towel to pat the incision dry following showers. °Continue to use ice for pain and swelling after surgery. °Do not use any lotions or creams on the incision until instructed by your surgeon. ° °Take Xarelto for two and a half more weeks, then discontinue Xarelto. °Once the patient has completed the blood thinner regimen, then take a Baby 81 mg Aspirin daily for three more weeks. ° ° ° °Information on my medicine - XARELTO® (Rivaroxaban) ° °This medication education was reviewed with me or my healthcare representative as part of my discharge preparation.   ° °Why was Xarelto®  prescribed for you? °Xarelto® was prescribed for you to reduce the risk of blood clots forming after orthopedic surgery. The medical term for these abnormal blood clots is venous thromboembolism (VTE). ° °What do you need to know about xarelto® ? °Take your Xarelto® ONCE DAILY at the same time every day. °You may take it either with   or without food. ° °If you have difficulty swallowing the tablet whole, you may crush it and mix in applesauce just prior to taking your dose. ° °Take Xarelto® exactly as prescribed by your doctor and DO NOT stop taking Xarelto® without talking to the doctor who prescribed the medication.  Stopping without other VTE prevention medication to take the place of Xarelto® may increase your risk of developing a clot. ° °After discharge, you should have regular check-up appointments with your healthcare provider that is prescribing your Xarelto®.   ° °What do you do if you miss a dose? °If you miss a dose, take it as soon as you remember on the same day then continue your regularly scheduled once daily regimen the next day. Do not take two doses of Xarelto® on the same day.  ° °Important Safety Information °A possible side effect of Xarelto® is bleeding. You should call your healthcare provider right away if you experience any of the following: °? Bleeding from an injury or your nose that does not stop. °? Unusual colored urine (red or dark brown) or unusual colored stools (red or black). °? Unusual bruising for unknown reasons. °? A serious fall or if you hit your head (even if there is no bleeding). ° °Some medicines may interact with Xarelto® and might increase your risk of bleeding while on Xarelto®. To help avoid this, consult your healthcare provider or pharmacist prior to using any new prescription or non-prescription medications, including herbals, vitamins, non-steroidal anti-inflammatory drugs (NSAIDs) and supplements. ° °This website has more information on Xarelto®:  [email or web address]. ° ° °

## 2015-03-26 NOTE — Discharge Summary (Signed)
Physician Discharge Summary   Patient ID: Jamie Galloway MRN: [id number] DOB/AGE: August 21, 1940 75 y.o.  Admit date: 03/25/2015 Discharge date: 03-26-2015  Primary Diagnosis:  Osteoarthritis of the Right hip.  Admission Diagnoses:  Past Medical History  Diagnosis Date  . Hemorrhoids     JUST OCCAS BLOOD - NO PROBLEMS AT PRESENT  . History of shingles     ABOUT 2012 - NO RESIDUAL PROBLEMS  . Arthritis     PAIN AND OA RIGHT KNEE  . History of skin cancer    Discharge Diagnoses:   Principal Problem:   OA (osteoarthritis) of hip  Estimated body mass index is 27.79 kg/(m^2) as calculated from the following:   Height as of this encounter: 5' 2"  (1.575 m).   Weight as of this encounter: 68.947 kg (152 lb).  Procedure(s) (LRB): RIGHT TOTAL HIP ARTHROPLASTY ANTERIOR APPROACH (Right)   Consults: None  HPI: Jamie Galloway is a 75 y.o. female who has advanced end-  stage arthritis of her Right hip with progressively worsening pain and  dysfunction.The patient has failed nonoperative management and presents for  total hip arthroplasty.   Laboratory Data: Admission on 03/25/2015  Component Date Value Ref Range Status  . WBC 03/26/2015 11.3* 4.0 - 10.5 K/uL Final  . RBC 03/26/2015 3.88  3.87 - 5.11 MIL/uL Final  . Hemoglobin 03/26/2015 12.2  12.0 - 15.0 g/dL Final  . HCT 03/26/2015 34.9* 36.0 - 46.0 % Final  . MCV 03/26/2015 89.9  78.0 - 100.0 fL Final  . MCH 03/26/2015 31.4  26.0 - 34.0 pg Final  . MCHC 03/26/2015 35.0  30.0 - 36.0 g/dL Final  . RDW 03/26/2015 12.9  11.5 - 15.5 % Final  . Platelets 03/26/2015 269  150 - 400 K/uL Final  . Sodium 03/26/2015 140  135 - 145 mmol/L Final  . Potassium 03/26/2015 4.0  3.5 - 5.1 mmol/L Final  . Chloride 03/26/2015 108  101 - 111 mmol/L Final  . CO2 03/26/2015 25  22 - 32 mmol/L Final  . Glucose, Bld 03/26/2015 123* 65 - 99 mg/dL Final  . BUN 03/26/2015 12  6 - 20 mg/dL Final  . Creatinine, Ser 03/26/2015 0.59  0.44 - 1.00 mg/dL Final  .  Calcium 03/26/2015 8.3* 8.9 - 10.3 mg/dL Final  . GFR calc non Af Amer 03/26/2015 >60  >60 mL/min Final  . GFR calc Af Amer 03/26/2015 >60  >60 mL/min Final   Comment: (NOTE) The eGFR has been calculated using the CKD EPI equation. This calculation has not been validated in all clinical situations. eGFR's persistently <60 mL/min signify possible Chronic Kidney Disease.   Georgiann Hahn gap 03/26/2015 7  5 - 15 Final  Hospital Outpatient Visit on 03/19/2015  Component Date Value Ref Range Status  . MRSA, PCR 03/19/2015 NEGATIVE  NEGATIVE Final  . Staphylococcus aureus 03/19/2015 NEGATIVE  NEGATIVE Final   Comment:        The Xpert SA Assay (FDA approved for NASAL specimens in patients over 69 years of age), is one component of a comprehensive surveillance program.  Test performance has been validated by [facility] for patients greater than or equal to 80 year old. It is not intended to diagnose infection nor to guide or monitor treatment.   Marland Kitchen aPTT 03/19/2015 26  24 - 37 seconds Final  . Prothrombin Time 03/19/2015 13.9  11.6 - 15.2 seconds Final  . INR 03/19/2015 1.05  0.00 - 1.49 Final  . ABO/RH(D) 03/19/2015 A  POS   Final  . Antibody Screen 03/19/2015 NEG   Final  . Sample Expiration 03/19/2015 03/28/2015   Final  . Extend sample reason 03/19/2015 NO TRANSFUSIONS OR PREGNANCY IN THE PAST 3 MONTHS   Final  . Color, Urine 03/19/2015 YELLOW  YELLOW Final  . APPearance 03/19/2015 CLEAR  CLEAR Final  . Specific Gravity, Urine 03/19/2015 1.022  1.005 - 1.030 Final  . pH 03/19/2015 5.5  5.0 - 8.0 Final  . Glucose, UA 03/19/2015 NEGATIVE  NEGATIVE mg/dL Final  . Hgb urine dipstick 03/19/2015 NEGATIVE  NEGATIVE Final  . Bilirubin Urine 03/19/2015 NEGATIVE  NEGATIVE Final  . Ketones, ur 03/19/2015 15* NEGATIVE mg/dL Final  . Protein, ur 03/19/2015 NEGATIVE  NEGATIVE mg/dL Final  . Nitrite 03/19/2015 NEGATIVE  NEGATIVE Final  . Leukocytes, UA 03/19/2015 NEGATIVE  NEGATIVE Final    MICROSCOPIC NOT DONE ON URINES WITH NEGATIVE PROTEIN, BLOOD, LEUKOCYTES, NITRITE, OR GLUCOSE <1000 mg/dL.     X-Rays:Dg Pelvis Portable  03/25/2015  CLINICAL DATA:  Postop right total hip arthroplasty. EXAM: PORTABLE PELVIS 1-2 VIEWS COMPARISON:  None. FINDINGS: Two AP views of the pelvis demonstrate right total hip arthroplasty without demonstrated complication. There is no evidence of acute fracture or dislocation. A surgical drain is in place. There is some gas within the soft tissues surrounding the proximal right femur. The bones are demineralized. There are advanced degenerative changes at the left hip with mild flattening of the left femoral head. IMPRESSION: No demonstrated complication following right total hip arthroplasty. Electronically Signed   By: Richardean Sale M.D.   On: 03/25/2015 10:48   Dg C-arm 1-60 Min-no Report  03/25/2015  CLINICAL DATA: surgery C-ARM 1-60 MINUTES Fluoroscopy was utilized by the requesting physician.  No radiographic interpretation.    EKG:No orders found for this or any previous visit.   Hospital Course: Patient was admitted to [facility] and taken to the OR and underwent the above state procedure without complications.  Patient tolerated the procedure well and was later transferred to the recovery room and then to the orthopaedic floor for postoperative care.  They were given PO and IV analgesics for pain control following their surgery.  They were given 24 hours of postoperative antibiotics of  Anti-infectives    Start     Dose/Rate Route Frequency Ordered Stop   03/25/15 1400  ceFAZolin (ANCEF) IVPB 2 g/50 mL premix     2 g 100 mL/hr over 30 Minutes Intravenous Every 6 hours 03/25/15 1308 03/25/15 1957   03/25/15 0615  ceFAZolin (ANCEF) IVPB 2 g/50 mL premix     2 g 100 mL/hr over 30 Minutes Intravenous On call to O.R. 03/25/15 7425 03/25/15 0844     and started on DVT prophylaxis in the form of Xarelto.   PT and OT were ordered for total hip  protocol.  The patient was allowed to be WBAT with therapy. Discharge planning was consulted to help with postop disposition and equipment needs.  Patient had a good night on the evening of surgery.  They started to get up OOB with therapy on day one.  Hemovac drain was pulled without difficulty. Patient was seen in rounds on POD 1 by Dr. Wynelle Link and was doing well.  It was felt that as long as they did well with therapy that they would be ready to go home.  Arrangements were made for discharge later that same day.  Discharge home with home health Diet - Regular diet Follow up -  in two weeks Activity - WBAT Disposition - Home Condition Upon Discharge - Stable D/C Meds - See DC Summary DVT Prophylaxis - Xarelto  Discharge Instructions    Call MD / Call 911    Complete by:  As directed   If you experience chest pain or shortness of breath, CALL 911 and be transported to the hospital emergency room.  If you develope a fever above 101 F, pus (white drainage) or increased drainage or redness at the wound, or calf pain, call your surgeon's office.     Change dressing    Complete by:  As directed   You may change your dressing dressing daily with sterile 4 x 4 inch gauze dressing and paper tape.  Do not submerge the incision under water.     Constipation Prevention    Complete by:  As directed   Drink plenty of fluids.  Prune juice may be helpful.  You may use a stool softener, such as Colace (over the counter) 100 mg twice a day.  Use MiraLax (over the counter) for constipation as needed.     Diet general    Complete by:  As directed      Discharge instructions    Complete by:  As directed   Pick up stool softner and laxative for home use following surgery while on pain medications. Do not submerge incision under water. May remove the surgical dressing tomorrow, Friday 03/27/2015, and then apply a dry gauze dressing daily. Please use good hand washing techniques while changing dressing each  day. May shower starting three days after surgery starting Saturday 03/28/2015. Please use a clean towel to pat the incision dry following showers. Continue to use ice for pain and swelling after surgery. Do not use any lotions or creams on the incision until instructed by your surgeon.  Postoperative Constipation Protocol  Constipation - defined medically as fewer than three stools per week and severe constipation as less than one stool per week.  One of the most common issues patients have following surgery is constipation. Even if you have a regular bowel pattern at home, your normal regimen is likely to be disrupted due to multiple reasons following surgery. Combination of anesthesia, postoperative narcotics, change in appetite and fluid intake all can affect your bowels. In order to avoid complications following surgery, here are some recommendations in order to help you during your recovery period.  Colace (docusate) - Pick up an over-the-counter form of Colace or another stool softener and take twice a day as long as you are requiring postoperative pain medications. Take with a full glass of water daily. If you experience loose stools or diarrhea, hold the colace until you stool forms back up. If your symptoms do not get better within 1 week or if they get worse, check with your doctor.  Dulcolax (bisacodyl) - Pick up over-the-counter and take as directed by the product packaging as needed to assist with the movement of your bowels. Take with a full glass of water. Use this product as needed if not relieved by Colace only.   MiraLax (polyethylene glycol) - Pick up over-the-counter to have on hand. MiraLax is a solution that will increase the amount of water in your bowels to assist with bowel movements. Take as directed and can mix with a glass of water, juice, soda, coffee, or tea. Take if you go more than two days without a movement. Do not use MiraLax more than once per day. Call  your doctor  if you are still constipated or irregular after using this medication for 7 days in a row.  If you continue to have problems with postoperative constipation, please contact the office for further assistance and recommendations. If you experience "the worst abdominal pain ever" or develop nausea or vomiting, please contact the office immediatly for further recommendations for treatment.  Take Xarelto for two and a half more weeks, then discontinue Xarelto. Once the patient has completed the blood thinner regimen, then take a Baby 81 mg Aspirin daily for three more weeks.     Do not sit on low chairs, stoools or toilet seats, as it may be difficult to get up from low surfaces    Complete by:  As directed      Driving restrictions    Complete by:  As directed   No driving until released by the physician.     Increase activity slowly as tolerated    Complete by:  As directed      Lifting restrictions    Complete by:  As directed   No lifting until released by the physician.     Patient may shower    Complete by:  As directed   You may shower without a dressing once there is no drainage.  Do not wash over the wound.  If drainage remains, do not shower until drainage stops.     TED hose    Complete by:  As directed   Use stockings (TED hose) for 3 weeks on both leg(s).  You may remove them at night for sleeping.     Weight bearing as tolerated    Complete by:  As directed   Laterality:  right  Extremity:  Lower            Medication List    STOP taking these medications        ADVIL 200 MG tablet  Generic drug:  ibuprofen     FLUZONE HIGH-DOSE 0.5 ML Susy  Generic drug:  Influenza Vac Split High-Dose      TAKE these medications        methocarbamol 500 MG tablet  Commonly known as:  ROBAXIN  Take 1 tablet (500 mg total) by mouth every 6 (six) hours as needed for muscle spasms.     minoxidil 2 % external solution  Commonly known as:  ROGAINE  Apply 1  application topically at bedtime.     oxyCODONE 5 MG immediate release tablet  Commonly known as:  Oxy IR/ROXICODONE  Take 1-2 tablets (5-10 mg total) by mouth every 3 (three) hours as needed for moderate pain or severe pain.     rivaroxaban 10 MG Tabs tablet  Commonly known as:  XARELTO  Take 1 tablet (10 mg total) by mouth daily with breakfast. Take Xarelto for two and a half more weeks, then discontinue Xarelto. Once the patient has completed the blood thinner regimen, then take a Baby 81 mg Aspirin daily for three more weeks.     sodium chloride 0.65 % Soln nasal spray  Commonly known as:  OCEAN  Place 1 spray into both nostrils as needed for congestion.     SYSTANE BALANCE OP  Apply 1-2 drops to eye daily as needed (dry eyes).     traMADol 50 MG tablet  Commonly known as:  ULTRAM  Take 1-2 tablets (50-100 mg total) by mouth every 6 (six) hours as needed (mild pain).  Follow-up Information    Follow up with Gearlean Alf, MD. Schedule an appointment as soon as possible for a visit on 04/07/2015.   Specialty:  Orthopedic Surgery   Why:  Call office at [phone] to setup appointment on Tuesday 3/21 with the PA, Dian Situ.   Contact information:   [address] [place] [address] [phone]       Signed: Arlee Muslim, PA-C Orthopaedic Surgery 03/26/2015, 8:31 AM

## 2015-03-27 DIAGNOSIS — Z96651 Presence of right artificial knee joint: Secondary | ICD-10-CM | POA: Diagnosis not present

## 2015-03-27 DIAGNOSIS — Z96641 Presence of right artificial hip joint: Secondary | ICD-10-CM | POA: Diagnosis not present

## 2015-03-27 DIAGNOSIS — Z471 Aftercare following joint replacement surgery: Secondary | ICD-10-CM | POA: Diagnosis not present

## 2015-03-27 DIAGNOSIS — Z87891 Personal history of nicotine dependence: Secondary | ICD-10-CM | POA: Diagnosis not present

## 2015-03-30 DIAGNOSIS — Z87891 Personal history of nicotine dependence: Secondary | ICD-10-CM | POA: Diagnosis not present

## 2015-03-30 DIAGNOSIS — Z96641 Presence of right artificial hip joint: Secondary | ICD-10-CM | POA: Diagnosis not present

## 2015-03-30 DIAGNOSIS — Z96651 Presence of right artificial knee joint: Secondary | ICD-10-CM | POA: Diagnosis not present

## 2015-03-30 DIAGNOSIS — Z471 Aftercare following joint replacement surgery: Secondary | ICD-10-CM | POA: Diagnosis not present

## 2015-04-01 DIAGNOSIS — Z96651 Presence of right artificial knee joint: Secondary | ICD-10-CM | POA: Diagnosis not present

## 2015-04-01 DIAGNOSIS — Z96641 Presence of right artificial hip joint: Secondary | ICD-10-CM | POA: Diagnosis not present

## 2015-04-01 DIAGNOSIS — Z87891 Personal history of nicotine dependence: Secondary | ICD-10-CM | POA: Diagnosis not present

## 2015-04-01 DIAGNOSIS — Z471 Aftercare following joint replacement surgery: Secondary | ICD-10-CM | POA: Diagnosis not present

## 2015-04-03 DIAGNOSIS — Z87891 Personal history of nicotine dependence: Secondary | ICD-10-CM | POA: Diagnosis not present

## 2015-04-03 DIAGNOSIS — Z96651 Presence of right artificial knee joint: Secondary | ICD-10-CM | POA: Diagnosis not present

## 2015-04-03 DIAGNOSIS — Z96641 Presence of right artificial hip joint: Secondary | ICD-10-CM | POA: Diagnosis not present

## 2015-04-03 DIAGNOSIS — Z471 Aftercare following joint replacement surgery: Secondary | ICD-10-CM | POA: Diagnosis not present

## 2015-04-06 DIAGNOSIS — Z471 Aftercare following joint replacement surgery: Secondary | ICD-10-CM | POA: Diagnosis not present

## 2015-04-06 DIAGNOSIS — Z87891 Personal history of nicotine dependence: Secondary | ICD-10-CM | POA: Diagnosis not present

## 2015-04-06 DIAGNOSIS — Z96641 Presence of right artificial hip joint: Secondary | ICD-10-CM | POA: Diagnosis not present

## 2015-04-06 DIAGNOSIS — Z96651 Presence of right artificial knee joint: Secondary | ICD-10-CM | POA: Diagnosis not present

## 2015-04-09 DIAGNOSIS — Z471 Aftercare following joint replacement surgery: Secondary | ICD-10-CM | POA: Diagnosis not present

## 2015-04-09 DIAGNOSIS — Z96641 Presence of right artificial hip joint: Secondary | ICD-10-CM | POA: Diagnosis not present

## 2015-04-28 DIAGNOSIS — Z96641 Presence of right artificial hip joint: Secondary | ICD-10-CM | POA: Diagnosis not present

## 2015-04-28 DIAGNOSIS — Z471 Aftercare following joint replacement surgery: Secondary | ICD-10-CM | POA: Diagnosis not present

## 2015-05-08 ENCOUNTER — Other Ambulatory Visit: Payer: Self-pay

## 2015-05-08 DIAGNOSIS — Z1231 Encounter for screening mammogram for malignant neoplasm of breast: Secondary | ICD-10-CM

## 2015-05-18 ENCOUNTER — Other Ambulatory Visit: Payer: Self-pay | Admitting: Family Medicine

## 2015-05-18 DIAGNOSIS — R222 Localized swelling, mass and lump, trunk: Secondary | ICD-10-CM

## 2015-05-18 DIAGNOSIS — Z0000 Encounter for general adult medical examination without abnormal findings: Secondary | ICD-10-CM | POA: Diagnosis not present

## 2015-05-21 DIAGNOSIS — Z961 Presence of intraocular lens: Secondary | ICD-10-CM | POA: Diagnosis not present

## 2015-05-21 DIAGNOSIS — H01001 Unspecified blepharitis right upper eyelid: Secondary | ICD-10-CM | POA: Diagnosis not present

## 2015-05-21 DIAGNOSIS — H52203 Unspecified astigmatism, bilateral: Secondary | ICD-10-CM | POA: Diagnosis not present

## 2015-05-21 DIAGNOSIS — H43813 Vitreous degeneration, bilateral: Secondary | ICD-10-CM | POA: Diagnosis not present

## 2015-05-27 ENCOUNTER — Ambulatory Visit
Admission: RE | Admit: 2015-05-27 | Discharge: 2015-05-27 | Disposition: A | Discharge status: Home / Self Care | Payer: Medicare Other | Source: Ambulatory Visit | Attending: Family Medicine | Admitting: Family Medicine

## 2015-05-27 DIAGNOSIS — R221 Localized swelling, mass and lump, neck: Secondary | ICD-10-CM | POA: Diagnosis not present

## 2015-05-27 DIAGNOSIS — R222 Localized swelling, mass and lump, trunk: Secondary | ICD-10-CM

## 2015-06-01 ENCOUNTER — Other Ambulatory Visit: Payer: Self-pay | Admitting: Family Medicine

## 2015-06-01 DIAGNOSIS — R221 Localized swelling, mass and lump, neck: Secondary | ICD-10-CM

## 2015-06-04 ENCOUNTER — Ambulatory Visit
Admission: RE | Admit: 2015-06-04 | Discharge: 2015-06-04 | Disposition: A | Discharge status: Home / Self Care | Payer: Medicare Other | Source: Ambulatory Visit

## 2015-06-04 DIAGNOSIS — Z1231 Encounter for screening mammogram for malignant neoplasm of breast: Secondary | ICD-10-CM

## 2015-06-08 ENCOUNTER — Ambulatory Visit
Admission: RE | Admit: 2015-06-08 | Discharge: 2015-06-08 | Disposition: A | Discharge status: Home / Self Care | Payer: Medicare Other | Source: Ambulatory Visit | Attending: Family Medicine | Admitting: Family Medicine

## 2015-06-08 DIAGNOSIS — R221 Localized swelling, mass and lump, neck: Secondary | ICD-10-CM | POA: Diagnosis not present

## 2015-06-08 MED ORDER — IOPAMIDOL (ISOVUE-300) INJECTION 61%
75.0000 mL | Freq: Once | INTRAVENOUS | Status: AC | PRN
  Administered 2015-06-08: 75 mL via INTRAVENOUS

## 2015-06-09 DIAGNOSIS — Z96651 Presence of right artificial knee joint: Secondary | ICD-10-CM | POA: Diagnosis not present

## 2015-06-09 DIAGNOSIS — Z471 Aftercare following joint replacement surgery: Secondary | ICD-10-CM | POA: Diagnosis not present

## 2015-06-22 ENCOUNTER — Other Ambulatory Visit: Payer: Self-pay | Admitting: Family Medicine

## 2015-06-22 DIAGNOSIS — R221 Localized swelling, mass and lump, neck: Secondary | ICD-10-CM

## 2015-06-30 ENCOUNTER — Other Ambulatory Visit: Payer: Self-pay | Admitting: Radiology

## 2015-06-30 ENCOUNTER — Other Ambulatory Visit: Payer: Self-pay | Admitting: Physician Assistant

## 2015-07-01 ENCOUNTER — Ambulatory Visit (HOSPITAL_COMMUNITY)
Admission: RE | Admit: 2015-07-01 | Discharge: 2015-07-01 | Disposition: A | Discharge status: Home / Self Care | Payer: Medicare Other | Source: Ambulatory Visit | Attending: Family Medicine | Admitting: Family Medicine

## 2015-07-01 ENCOUNTER — Encounter (HOSPITAL_COMMUNITY): Payer: Self-pay

## 2015-07-01 DIAGNOSIS — Z85828 Personal history of other malignant neoplasm of skin: Secondary | ICD-10-CM | POA: Insufficient documentation

## 2015-07-01 DIAGNOSIS — Z79899 Other long term (current) drug therapy: Secondary | ICD-10-CM | POA: Diagnosis not present

## 2015-07-01 DIAGNOSIS — R590 Localized enlarged lymph nodes: Secondary | ICD-10-CM | POA: Diagnosis not present

## 2015-07-01 DIAGNOSIS — R221 Localized swelling, mass and lump, neck: Secondary | ICD-10-CM | POA: Diagnosis not present

## 2015-07-01 DIAGNOSIS — Z87891 Personal history of nicotine dependence: Secondary | ICD-10-CM | POA: Diagnosis not present

## 2015-07-01 DIAGNOSIS — I898 Other specified noninfective disorders of lymphatic vessels and lymph nodes: Secondary | ICD-10-CM | POA: Diagnosis not present

## 2015-07-01 LAB — APTT: APTT: 26 s (ref 24–37)

## 2015-07-01 LAB — CBC
HEMATOCRIT: 45.8 % (ref 36.0–46.0)
HEMOGLOBIN: 15.7 g/dL — AB (ref 12.0–15.0)
MCH: 29.2 pg (ref 26.0–34.0)
MCHC: 34.3 g/dL (ref 30.0–36.0)
MCV: 85.1 fL (ref 78.0–100.0)
Platelets: 272 10*3/uL (ref 150–400)
RBC: 5.38 MIL/uL — ABNORMAL HIGH (ref 3.87–5.11)
RDW: 13.9 % (ref 11.5–15.5)
WBC: 6.6 10*3/uL (ref 4.0–10.5)

## 2015-07-01 LAB — PROTIME-INR
INR: 1.13 (ref 0.00–1.49)
Prothrombin Time: 14.7 seconds (ref 11.6–15.2)

## 2015-07-01 MED ORDER — SODIUM CHLORIDE 0.9 % IV SOLN
INTRAVENOUS | Status: DC
  Administered 2015-07-01: 12:00:00 via INTRAVENOUS

## 2015-07-01 MED ORDER — FENTANYL CITRATE (PF) 100 MCG/2ML IJ SOLN
INTRAMUSCULAR | Status: AC | PRN
  Administered 2015-07-01: 50 ug via INTRAVENOUS

## 2015-07-01 MED ORDER — SODIUM CHLORIDE 0.9 % IV SOLN
INTRAVENOUS | Status: AC | PRN
  Administered 2015-07-01: 10 mL/h via INTRAVENOUS

## 2015-07-01 MED ORDER — MIDAZOLAM HCL 2 MG/2ML IJ SOLN
INTRAMUSCULAR | Status: AC
  Filled 2015-07-01: qty 2

## 2015-07-01 MED ORDER — MIDAZOLAM HCL 2 MG/2ML IJ SOLN
INTRAMUSCULAR | Status: AC | PRN
  Administered 2015-07-01: 1 mg via INTRAVENOUS

## 2015-07-01 MED ORDER — FENTANYL CITRATE (PF) 100 MCG/2ML IJ SOLN
INTRAMUSCULAR | Status: AC
  Filled 2015-07-01: qty 2

## 2015-07-01 MED ORDER — LIDOCAINE HCL (PF) 1 % IJ SOLN
INTRAMUSCULAR | Status: AC
  Filled 2015-07-01: qty 10

## 2015-07-01 NOTE — Sedation Documentation (Signed)
Patient denies pain and is resting comfortably.  

## 2015-07-01 NOTE — Procedures (Signed)
Technically successful US guided biopsy of indeterminate right supraclavicular LN biopsy.   EBL: Minimal   No immediate complications.   Ronny Bacon, MD Pager #: [phone]

## 2015-07-01 NOTE — H&P (Signed)
Chief Complaint: Patient was seen in consultation today for right neck mass biopsy at the request of Rankins,Victoria R  Referring Physician(s): Rankins,Victoria R  Supervising Physician: Sandi Mariscal  Patient Status: Outpatient  History of Present Illness: Jamie Galloway is a 75 y.o. female   Hx Basal cell skin cancers Recent Rt hip replacement 03/2015 Pt developed Rt shoulder pain several weeks ago Worsened and sought medical care Work up: CT 5/22: IMPRESSION: Right supraclavicular 2 x 1.7 x 1.4 cm  partially enhancing mass may represent pathologically enlarged lymph node. Histologic correlation may be considered (vessels are seen along the periphery of this structure). No primary neck mass or lung apical lesion identified as cause of adenopathy.  Request for R SCLN biopsy  Past Medical History  Diagnosis Date  . Hemorrhoids     JUST OCCAS BLOOD - NO PROBLEMS AT PRESENT  . History of shingles     ABOUT 2012 - NO RESIDUAL PROBLEMS  . Arthritis     PAIN AND OA RIGHT KNEE  . History of skin cancer     Past Surgical History  Procedure Laterality Date  . Eye surgery      BILATERAL CATARACT EXTRACTIONS  . 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 Tubal ligation      AGE 63  . Total knee arthroplasty Right 11/18/2013    Procedure: TOTAL RIGHT KNEE ARTHROPLASTY;  Surgeon: Gearlean Alf, MD;  Location: [facility]S;  Service: Orthopedics;  Laterality: Right;  . Total hip arthroplasty Right 03/25/2015    Procedure: RIGHT TOTAL HIP ARTHROPLASTY ANTERIOR APPROACH;  Surgeon: Gaynelle Arabian, MD;  Location: [facility]S;  Service: Orthopedics;  Laterality: Right;    Allergies: Review of patient's allergies indicates no known allergies.  Medications: Prior to Admission medications   Medication Sig Start Date End Date Taking? Authorizing Provider  ibuprofen (ADVIL,MOTRIN) 200 MG tablet Take 400 mg by mouth every 8 (eight) hours as needed for mild pain or moderate pain.   Yes  Historical Provider, MD  minoxidil (ROGAINE) 2 % external solution Apply 1 application topically at bedtime.   Yes Historical Provider, MD  sodium chloride (OCEAN) 0.65 % SOLN nasal spray Place 1 spray into both nostrils as needed for congestion.   Yes Historical Provider, MD  Propylene Glycol (SYSTANE BALANCE OP) Apply 1-2 drops to eye daily as needed (dry eyes).    Historical Provider, MD     History reviewed. No pertinent family history.  Social History   Social History  . Marital Status: Married    Spouse Name: N/A  . Number of Children: N/A  . Years of Education: N/A   Social History Main Topics  . Smoking status: Former Smoker    Quit date: 03/18/1965  . Smokeless tobacco: Never Used  . Alcohol Use: Yes     Comment:   GLASS OF WINE WITH DINNER  . Drug Use: No  . Sexual Activity: Not Asked   Other Topics Concern  . None   Social History Narrative     Review of Systems: A 12 point ROS discussed and pertinent positives are indicated in the HPI above.  All other systems are negative.  Review of Systems  Constitutional: Negative for fever, activity change and fatigue.  HENT: Negative for hearing loss, sore throat, tinnitus and trouble swallowing.   Respiratory: Negative for shortness of breath.   Cardiovascular: Negative for chest pain.  Neurological: Negative for weakness.  Psychiatric/Behavioral: Negative for  behavioral problems and confusion.    Vital Signs: BP 167/84 mmHg  Pulse 75  Temp(Src) 97.6 F (36.4 C) (Oral)  Resp 16  Ht 5\' 2"  (1.575 m)  Wt 143 lb (64.864 kg)  BMI 26.15 kg/m2  SpO2 98%  Physical Exam  Constitutional: She is oriented to person, place, and time. She appears well-nourished.  Cardiovascular: Normal rate, regular rhythm and normal heart sounds.   Pulmonary/Chest: Effort normal and breath sounds normal. She has no wheezes.  Abdominal: Soft. Bowel sounds are normal.  Musculoskeletal: Normal range of motion.  Neurological: She is alert  and oriented to person, place, and time.  Skin: Skin is warm and dry.  Psychiatric: She has a normal mood and affect. Her behavior is normal. Judgment and thought content normal.  Nursing note and vitals reviewed.   Mallampati Score:  MD Evaluation Airway: WNL Heart: WNL Abdomen: WNL Chest/ Lungs: WNL ASA  Classification: 2 Mallampati/Airway Score: Two  Imaging: Ct Soft Tissue Neck W Contrast  06/08/2015  CLINICAL DATA:  75 year old female with history of basal cell skin cancer removed from leg and face. Former smoker. Presenting with right-sided neck mass. No pain. Subsequent encounter. EXAM: CT NECK WITH CONTRAST TECHNIQUE: Multidetector CT imaging of the neck was performed using the standard protocol following the bolus administration of intravenous contrast. CONTRAST:  46mL ISOVUE-300 IOPAMIDOL (ISOVUE-300) INJECTION 61% COMPARISON:  05/27/2015 ultrasound. FINDINGS: Pharynx and larynx: Minimal asymmetry without discrete mass identified. Salivary glands: No primary salivary gland mass. Thyroid: Heterogeneous with sub cm nodularity and calcifications. Lymph nodes: Right supraclavicular 2 x 1.7 x 1.4 cm partially enhancing mass may represent pathologically enlarged lymph node. Histologic correlation may be considered. Vascular: Atherosclerotic changes including bilateral carotid bifurcation calcifications with narrowing greater on the right which does not appear to be greater than 70%. Limited intracranial: Negative for mass. Visualized orbits: Limited imaging without mass. Post lens replacement. Mastoids and visualized paranasal sinuses: Clear. 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 Upper chest: No lung apical mass. IMPRESSION: Right supraclavicular 2 x 1.7 x 1.4 cm partially enhancing mass may represent pathologically enlarged lymph node. Histologic correlation may be considered (vessels are  seen along the periphery of this structure). No primary neck mass or lung apical lesion identified as cause of adenopathy. Atherosclerotic type changes with bilateral carotid bifurcation calcification and narrowing greater on right (does not appear to be hemodynamically significant). 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 Electronically Signed   By: Genia Del M.D.   On: 06/08/2015 17:02   Mm Screening Breast Tomo Bilateral  06/04/2015  CLINICAL DATA:  Screening. EXAM: 2D DIGITAL SCREENING BILATERAL MAMMOGRAM WITH CAD AND ADJUNCT TOMO COMPARISON:  Previous exam(s). ACR Breast Density Category a: The breast tissue is almost entirely fatty. FINDINGS: There are no findings suspicious for malignancy. Images were processed with CAD. IMPRESSION: No mammographic evidence of malignancy. A result letter of this screening mammogram will be mailed directly to the patient. RECOMMENDATION: Screening mammogram in one year. (Code:SM-B-01Y) BI-RADS CATEGORY  1: Negative. Electronically Signed   By: Nolon Nations M.D.   On: 06/04/2015 15:51    Labs:  CBC:  Recent Labs  03/26/15 0349  WBC 11.3*  HGB 12.2  HCT 34.9*  PLT 269    COAGS:  Recent Labs  03/19/15 1500  INR 1.05  APTT 26    BMP:  Recent Labs  03/26/15 0349  NA 140  K 4.0  CL 108  CO2 25  GLUCOSE 123*  BUN 12  CALCIUM 8.3*  CREATININE 0.59  GFRNONAA >60  GFRAA >60    LIVER FUNCTION TESTS: No results for input(s): BILITOT, AST, ALT, ALKPHOS, PROT, ALBUMIN in the last 8760 hours.  TUMOR MARKERS: No results for input(s): AFPTM, CEA, CA199, CHROMGRNA in the last 8760 hours.  Assessment and Plan:  R shoulder pain for weeks Work up reveals R supraclavicular LAN Now for biopsy Risks and Benefits discussed with the patient including, but not limited to bleeding, infection, damage to adjacent structures or low yield requiring additional tests. All of the patient's questions were answered, patient is agreeable to  proceed. Consent signed and in chart.   Thank you for this interesting consult.  I greatly enjoyed meeting Jamie Galloway and look forward to participating in their care.  A copy of this report was sent to the requesting provider on this date.  Electronically Signed: Siobhan Zaro A 07/01/2015, 12:09 PM   I spent a total of  30 Minutes   in face to face in clinical consultation, greater than 50% of which was counseling/coordinating care for R SCLN bx

## 2015-07-01 NOTE — Discharge Instructions (Signed)
Biopsy Discharge Instructions  The procedure you just had is called a biopsy.  You may feel some discomfort after the local anesthetic wears off.  Your discomfort should improve over the next several days.  AFTER YOUR BIOPSY  Rest for the remainder of the day.  Avoid heavy lifting (more than 10 lb/4.5 kg).  Only take over-the-counter or prescription medicines for pain, discomfort, or fever as directed by your caregiver.  This can make bleeding worse.  You may resume your usual diet after the procedure.  Avoid alcoholic beverages for 24 hours after your procedure.  Keep the skin around your biopsy site clean and dry.  You may shower after 24 hours.  Cleanse and dry the biopsy site completely after you shower.  Avoid baths and swimming for 72 hours.  Complications are very uncommon after this procedure.  Go to the nearest Emergency Department or contact your caregiver if you develop any of the following symptoms:  Worsening pain  Bleeding  Swelling at the biopsy site  Light headedness or dizziness  Shortness of Breath  Fever or chills  Redness or increased pain or swelling at the biopsy site

## 2015-08-12 DIAGNOSIS — Z136 Encounter for screening for cardiovascular disorders: Secondary | ICD-10-CM | POA: Diagnosis not present

## 2015-08-12 DIAGNOSIS — M1612 Unilateral primary osteoarthritis, left hip: Secondary | ICD-10-CM | POA: Diagnosis not present

## 2015-08-12 DIAGNOSIS — R9431 Abnormal electrocardiogram [ECG] [EKG]: Secondary | ICD-10-CM | POA: Diagnosis not present

## 2015-08-12 DIAGNOSIS — Z01810 Encounter for preprocedural cardiovascular examination: Secondary | ICD-10-CM | POA: Diagnosis not present

## 2015-08-12 DIAGNOSIS — Z01818 Encounter for other preprocedural examination: Secondary | ICD-10-CM | POA: Diagnosis not present

## 2015-08-17 DIAGNOSIS — Z01810 Encounter for preprocedural cardiovascular examination: Secondary | ICD-10-CM | POA: Diagnosis not present

## 2015-08-17 DIAGNOSIS — I6523 Occlusion and stenosis of bilateral carotid arteries: Secondary | ICD-10-CM | POA: Diagnosis not present

## 2015-08-17 NOTE — Progress Notes (Signed)
Please place orders in EPIC as patient has a pre-op appointment with the nurse at [facility]  on Thursday 09/03/2015 at 1 pm!. Thank you!

## 2015-08-21 ENCOUNTER — Ambulatory Visit: Payer: Self-pay | Admitting: Orthopedic Surgery

## 2015-08-24 DIAGNOSIS — Z01810 Encounter for preprocedural cardiovascular examination: Secondary | ICD-10-CM | POA: Diagnosis not present

## 2015-08-26 DIAGNOSIS — I6523 Occlusion and stenosis of bilateral carotid arteries: Secondary | ICD-10-CM | POA: Diagnosis not present

## 2015-08-31 DIAGNOSIS — Z01810 Encounter for preprocedural cardiovascular examination: Secondary | ICD-10-CM | POA: Diagnosis not present

## 2015-08-31 DIAGNOSIS — I6523 Occlusion and stenosis of bilateral carotid arteries: Secondary | ICD-10-CM | POA: Diagnosis not present

## 2015-09-01 ENCOUNTER — Ambulatory Visit: Payer: Self-pay | Admitting: Orthopedic Surgery

## 2015-09-03 ENCOUNTER — Encounter (HOSPITAL_COMMUNITY): Payer: Self-pay

## 2015-09-03 ENCOUNTER — Encounter (HOSPITAL_COMMUNITY)
Admission: RE | Admit: 2015-09-03 | Discharge: 2015-09-03 | Disposition: A | Discharge status: Home / Self Care | Payer: Medicare Other | Source: Ambulatory Visit | Attending: Orthopedic Surgery | Admitting: Orthopedic Surgery

## 2015-09-03 DIAGNOSIS — Z9889 Other specified postprocedural states: Secondary | ICD-10-CM | POA: Insufficient documentation

## 2015-09-03 DIAGNOSIS — M179 Osteoarthritis of knee, unspecified: Secondary | ICD-10-CM | POA: Diagnosis not present

## 2015-09-03 DIAGNOSIS — Z01812 Encounter for preprocedural laboratory examination: Secondary | ICD-10-CM | POA: Insufficient documentation

## 2015-09-03 DIAGNOSIS — Z85828 Personal history of other malignant neoplasm of skin: Secondary | ICD-10-CM | POA: Diagnosis not present

## 2015-09-03 DIAGNOSIS — M169 Osteoarthritis of hip, unspecified: Secondary | ICD-10-CM | POA: Insufficient documentation

## 2015-09-03 HISTORY — DX: Personal history of urinary (tract) infections: Z87.440

## 2015-09-03 LAB — APTT: aPTT: 28 seconds (ref 24–36)

## 2015-09-03 LAB — URINALYSIS, ROUTINE W REFLEX MICROSCOPIC
BILIRUBIN URINE: NEGATIVE
GLUCOSE, UA: NEGATIVE mg/dL
Hgb urine dipstick: NEGATIVE
KETONES UR: NEGATIVE mg/dL
LEUKOCYTES UA: NEGATIVE
NITRITE: NEGATIVE
PH: 5.5 (ref 5.0–8.0)
Protein, ur: NEGATIVE mg/dL
Specific Gravity, Urine: 1.022 (ref 1.005–1.030)

## 2015-09-03 LAB — SURGICAL PCR SCREEN
MRSA, PCR: NEGATIVE
Staphylococcus aureus: NEGATIVE

## 2015-09-03 LAB — PROTIME-INR
INR: 1
Prothrombin Time: 13.2 seconds (ref 11.4–15.2)

## 2015-09-03 NOTE — Progress Notes (Signed)
EKG per chart 08/12/2015  OV note per Dr Radene Ou 08/12/2015 per chart  CBCD and CMP results per OV note 08/12/2015

## 2015-09-03 NOTE — Patient Instructions (Addendum)
Jamie Galloway  09/03/2015   Your procedure is scheduled on: Wednesday September 09, 2015  Report to [facility]  Entrance take [place]  elevators to 3rd floor to  [place] at 6:30 AM.  Call this number if you have problems the morning of surgery [phone]   Remember: ONLY 1 PERSON MAY GO WITH YOU TO SHORT STAY TO GET  READY MORNING OF [place].  Do not eat food or drink liquids :After Midnight.     Take these medicines the morning of surgery: NONE                               You may not have any metal on your body including hair pins and              piercings  Do not wear jewelry, make-up, lotions, powders or perfumes, deodorant             Do not wear nail polish.  Do not shave  48 hours prior to surgery.               Do not bring valuables to the hospital. [place].  Contacts, dentures or bridgework may not be worn into surgery.  Leave suitcase in the car. After surgery it may be brought to your room.                Please read over the following fact sheets you were given:MRSA INFORMATION SHEET; INCENTIVE SPIROMETER; BLOOD TRANSFUSION INFORMATION SHEET  _____________________________________________________________________   [facility] - Preparing for Surgery Before surgery, you can play an important role.  Because skin is not sterile, your skin needs to be as free of germs as possible.  You can reduce the number of germs on your skin by washing with CHG (chlorahexidine gluconate) soap before surgery.  CHG is an antiseptic cleaner which kills germs and bonds with the skin to continue killing germs even after washing. Please DO NOT use if you have an allergy to CHG or antibacterial soaps.  If your skin becomes reddened/irritated stop using the CHG and inform your nurse when you arrive at Short Stay. Do not shave (including legs and underarms) for at least 48 hours prior to the first CHG  shower.  You may shave your face/neck. Please follow these instructions carefully:  1.  Shower with CHG Soap the night before surgery and the  morning of Surgery.  2.  If you choose to wash your hair, wash your hair first as usual with your  normal  shampoo.  3.  After you shampoo, rinse your hair and body thoroughly to remove the  shampoo.                           4.  Use CHG as you would any other liquid soap.  You can apply chg directly  to the skin and wash                       Gently with a scrungie or clean washcloth.  5.  Apply the CHG Soap to your body ONLY FROM THE NECK DOWN.   Do not use  on face/ open                           Wound or open sores. Avoid contact with eyes, ears mouth and genitals (private parts).                       Wash face,  Genitals (private parts) with your normal soap.             6.  Wash thoroughly, paying special attention to the area where your surgery  will be performed.  7.  Thoroughly rinse your body with warm water from the neck down.  8.  DO NOT shower/wash with your normal soap after using and rinsing off  the CHG Soap.                9.  Pat yourself dry with a clean towel.            10.  Wear clean pajamas.            11.  Place clean sheets on your bed the night of your first shower and do not  sleep with pets. Day of Surgery : Do not apply any lotions/deodorants the morning of surgery.  Please wear clean clothes to the hospital/surgery center.  FAILURE TO FOLLOW THESE INSTRUCTIONS MAY RESULT IN THE CANCELLATION OF YOUR SURGERY PATIENT SIGNATURE_________________________________  NURSE SIGNATURE__________________________________  ________________________________________________________________________             Adam Phenix  An incentive spirometer is a tool that can help keep your lungs clear and active. This tool measures how well you are filling your lungs with each breath. Taking long deep breaths may help reverse or  decrease the chance of developing breathing (pulmonary) problems (especially infection) following:  A long period of time when you are unable to move or be active. BEFORE THE PROCEDURE   If the spirometer includes an indicator to show your best effort, your nurse or respiratory therapist will set it to a desired goal.  If possible, sit up straight or lean slightly forward. Try not to slouch.  Hold the incentive spirometer in an upright position. INSTRUCTIONS FOR USE  1. Sit on the edge of your bed if possible, or sit up as far as you can in bed or on a chair. 2. Hold the incentive spirometer in an upright position. 3. Breathe out normally. 4. Place the mouthpiece in your mouth and seal your lips tightly around it. 5. Breathe in slowly and as deeply as possible, raising the piston or the ball toward the top of the column. 6. Hold your breath for 3-5 seconds or for as long as possible. Allow the piston or ball to fall to the bottom of the column. 7. Remove the mouthpiece from your mouth and breathe out normally. 8. Rest for a few seconds and repeat Steps 1 through 7 at least 10 times every 1-2 hours when you are awake. Take your time and take a few normal breaths between deep breaths. 9. The spirometer may include an indicator to show your best effort. Use the indicator as a goal to work toward during each repetition. 10. After each set of 10 deep breaths, practice coughing to be sure your lungs are clear. If you have an incision (the cut made at the time of surgery), support your incision when coughing by placing a pillow or rolled up towels firmly against it. Once  you are able to get out of bed, walk around indoors and cough well. You may stop using the incentive spirometer when instructed by your caregiver.  RISKS AND COMPLICATIONS  Take your time so you do not get dizzy or light-headed.  If you are in pain, you may need to take or ask for pain medication before doing incentive spirometry.  It is harder to take a deep breath if you are having pain. AFTER USE  Rest and breathe slowly and easily.  It can be helpful to keep track of a log of your progress. Your caregiver can provide you with a simple table to help with this. If you are using the spirometer at home, follow these instructions: [place] IF:   You are having difficultly using the spirometer.  You have trouble using the spirometer as often as instructed.  Your pain medication is not giving enough relief while using the spirometer.  You develop fever of 100.5 F (38.1 C) or higher. SEEK IMMEDIATE MEDICAL CARE IF:   You cough up bloody sputum that had not been present before.  You develop fever of 102 F (38.9 C) or greater.  You develop worsening pain at or near the incision site. MAKE SURE YOU:   Understand these instructions.  Will watch your condition.  Will get help right away if you are not doing well or get worse. Document Released: 05/16/2006 Document Revised: 03/28/2011 Document Reviewed: 07/17/2006 Exit[employer] Patient Information 2014 [employer], [place].   ________________________________________________________________________  WHAT IS A BLOOD TRANSFUSION? Blood Transfusion Information  A transfusion is the replacement of blood or some of its parts. Blood is made up of multiple cells which provide different functions.  Red blood cells carry oxygen and are used for blood loss replacement.  White blood cells fight against infection.  Platelets control bleeding.  Plasma helps clot blood.  Other blood products are available for specialized needs, such as hemophilia or other clotting disorders. BEFORE THE TRANSFUSION  Who gives blood for transfusions?   Healthy volunteers who are fully evaluated to make sure their blood is safe. This is blood bank blood. Transfusion therapy is the safest it has ever been in the practice of medicine. Before blood is taken from a donor, a complete  history is taken to make sure that person has no history of diseases nor engages in risky social behavior (examples are intravenous drug use or sexual activity with multiple partners). The donor's travel history is screened to minimize risk of transmitting infections, such as malaria. The donated blood is tested for signs of infectious diseases, such as HIV and hepatitis. The blood is then tested to be sure it is compatible with you in order to minimize the chance of a transfusion reaction. If you or a relative donates blood, this is often done in anticipation of surgery and is not appropriate for emergency situations. It takes many days to process the donated blood. RISKS AND COMPLICATIONS Although transfusion therapy is very safe and saves many lives, the main dangers of transfusion include:   Getting an infectious disease.  Developing a transfusion reaction. This is an allergic reaction to something in the blood you were given. Every precaution is taken to prevent this. The decision to have a blood transfusion has been considered carefully by your caregiver before blood is given. Blood is not given unless the benefits outweigh the risks. AFTER THE TRANSFUSION  Right after receiving a blood transfusion, you will usually feel much better and more energetic. This  is especially true if your red blood cells have gotten low (anemic). The transfusion raises the level of the red blood cells which carry oxygen, and this usually causes an energy increase.  The nurse administering the transfusion will monitor you carefully for complications. HOME CARE INSTRUCTIONS  No special instructions are needed after a transfusion. You may find your energy is better. Speak with your caregiver about any limitations on activity for underlying diseases you may have. SEEK MEDICAL CARE IF:   Your condition is not improving after your transfusion.  You develop redness or irritation at the intravenous (IV) site. SEEK  IMMEDIATE MEDICAL CARE IF:  Any of the following symptoms occur over the next 12 hours:  Shaking chills.  You have a temperature by mouth above 102 F (38.9 C), not controlled by medicine.  Chest, back, or muscle pain.  People around you feel you are not acting correctly or are confused.  Shortness of breath or difficulty breathing.  Dizziness and fainting.  You get a rash or develop hives.  You have a decrease in urine output.  Your urine turns a dark color or changes to pink, red, or brown. Any of the following symptoms occur over the next 10 days:  You have a temperature by mouth above 102 F (38.9 C), not controlled by medicine.  Shortness of breath.  Weakness after normal activity.  The white part of the eye turns yellow (jaundice).  You have a decrease in the amount of urine or are urinating less often.  Your urine turns a dark color or changes to pink, red, or brown. Document Released: 01/01/2000 Document Revised: 03/28/2011 Document Reviewed: 08/20/2007 [facility] Patient Information 2014 [place], [place].  _______________________________________________________________________

## 2015-09-04 NOTE — Progress Notes (Signed)
Preoperative cardiac clearance per chart per Dr Einar Gip 08/24/2015 Nuclear cardiac report per chart 08/24/2015

## 2015-09-08 NOTE — H&P (Signed)
TOTAL HIP ADMISSION H&P  Patient is admitted for left total hip arthroplasty.  Subjective:  Chief Complaint: left hip pain  HPI: Jamie Galloway, 75 y.o. female, has a history of pain and functional disability in the left hip(s) due to arthritis and patient has failed non-surgical conservative treatments for greater than 12 weeks to include NSAID's and/or analgesics, corticosteriod injections and activity modification.  Onset of symptoms was gradual starting 2 years ago with gradually worsening course since that time.The patient noted no past surgery on the left hip(s).  Patient currently rates pain in the left hip at 8 out of 10 with activity. Patient has night pain, worsening of pain with activity and weight bearing, pain that interfers with activities of daily living and pain with passive range of motion. Patient has evidence of periarticular osteophytes and joint space narrowing by imaging studies. This condition presents safety issues increasing the risk of falls.  There is no current active infection.  Patient Active Problem List   Diagnosis Date Noted  . OA (osteoarthritis) of hip 03/25/2015  . OA (osteoarthritis) of knee 11/18/2013   Past Medical History:  Diagnosis Date  . Arthritis    PAIN AND OA RIGHT KNEE  . Hemorrhoids    JUST OCCAS BLOOD - NO PROBLEMS AT PRESENT  . History of frequent urinary tract infections   . History of shingles    ABOUT 2012 - NO RESIDUAL PROBLEMS  . History of skin cancer     Past Surgical History:  Procedure Laterality Date  . EYE SURGERY     BILATERAL CATARACT EXTRACTIONS  . TONSILLECTOMY    . TOTAL HIP ARTHROPLASTY Right 03/25/2015   Procedure: RIGHT TOTAL HIP ARTHROPLASTY ANTERIOR APPROACH;  Surgeon: Gaynelle Arabian, MD;  Location: [facility];  Service: Orthopedics;  Laterality: Right;  . TOTAL KNEE ARTHROPLASTY Right 11/18/2013   Procedure: TOTAL RIGHT KNEE ARTHROPLASTY;  Surgeon: Gearlean Alf, MD;  Location: [facility];  Service: Orthopedics;   Laterality: Right;  . TUBAL LIGATION     AGE 38  . URETHRAL STRICTURE DILATATION     49 YRS AGO    Current Outpatient Prescriptions:  .  aspirin 81 MG chewable tablet, Chew 81 mg by mouth daily with lunch., Disp: , Rfl:  .  ibuprofen (ADVIL,MOTRIN) 200 MG tablet, Take 400 mg by mouth every 8 (eight) hours as needed for mild pain or moderate pain., Disp: , Rfl:  .  minoxidil (ROGAINE) 2 % external solution, Apply 1 application topically at bedtime., Disp: , Rfl:  .  rosuvastatin (CRESTOR) 10 MG tablet, Take 10 mg by mouth at bedtime. , Disp: , Rfl: 5  No Known Allergies  Social History  Substance Use Topics  . Smoking status: Former Smoker    Packs/day: 0.50    Years: 5.00    Types: Cigarettes    Quit date: 03/19/1958  . Smokeless tobacco: Never Used  . Alcohol use Yes     Comment:   GLASS OF WINE WITH DINNER      Review of Systems  Constitutional: Negative.   HENT: Negative.   Eyes: Negative.   Respiratory: Negative.   Cardiovascular: Negative.   Gastrointestinal: Negative.   Genitourinary: Positive for frequency. Negative for dysuria, flank pain, hematuria and urgency.  Musculoskeletal: Positive for joint pain and myalgias. Negative for back pain, falls and neck pain.  Skin: Negative.   Neurological: Negative.   Endo/Heme/Allergies: Negative.   Psychiatric/Behavioral: Negative.     Objective:  Physical Exam  Constitutional:  She is oriented to person, place, and time. She appears well-developed and well-nourished. No distress.  HENT:  Head: Normocephalic and atraumatic.  Right Ear: External ear normal.  Left Ear: External ear normal.  Nose: Nose normal.  Mouth/Throat: Oropharynx is clear and moist.  Eyes: Conjunctivae and EOM are normal.  Neck: Normal range of motion. Neck supple.  Cardiovascular: Normal rate, regular rhythm, normal heart sounds and intact distal pulses.   No murmur heard. Respiratory: Effort normal and breath sounds normal. No respiratory  distress. She has no wheezes.  GI: Soft. Bowel sounds are normal. She exhibits no distension. There is no tenderness.  Musculoskeletal:       Right hip: Normal.       Right knee: Normal.       Left knee: Normal.  Her right hip can be flexed to 120, rotated in 30, out 40, abducted 40 without discomfort. Left hip shows flexion to 90 degrees, no internal rotation, about 10 degrees of external rotation, 10 to 20 degrees of abduction. Incision over right total hip and right total knee healed with no signs of infection  Neurological: She is alert and oriented to person, place, and time. She has normal strength. No sensory deficit.  Skin: No rash noted. She is not diaphoretic. No erythema.  Psychiatric: She has a normal mood and affect. Her behavior is normal.    Vitals  Weight: 147 lb Height: 62.5in Body Surface Area: 1.69 m Body Mass Index: 26.46 kg/m  Pulse: 84 (Regular)  BP: 118/72 (Sitting, Left Arm, Standard)  Imaging Review Plain radiographs demonstrate severe degenerative joint disease of the left hip(s). The bone quality appears to be good for age and reported activity level.  Assessment/Plan:  End stage primary osteoarthritis, left hip(s)  The patient history, physical examination, clinical judgement of the provider and imaging studies are consistent with end stage degenerative joint disease of the left hip(s) and total hip arthroplasty is deemed medically necessary. The treatment options including medical management, injection therapy, arthroscopy and arthroplasty were discussed at length. The risks and benefits of total hip arthroplasty were presented and reviewed. The risks due to aseptic loosening, infection, stiffness, dislocation/subluxation,  thromboembolic complications and other imponderables were discussed.  The patient acknowledged the explanation, agreed to proceed with the plan and consent was signed. Patient is being admitted for inpatient treatment for surgery,  pain control, PT, OT, prophylactic antibiotics, VTE prophylaxis, progressive ambulation and ADL's and discharge planning.The patient is planning to be discharged home with HEP    PCP: Dr. Radene Ou Cardio: Dr. Einar Gip Therapy Plans: HEP; no formal therapy Home with sister and daughter Wants spinal anesthesia   Ardeen Jourdain, PA-C

## 2015-09-09 ENCOUNTER — Encounter (HOSPITAL_COMMUNITY): Payer: Self-pay

## 2015-09-09 ENCOUNTER — Inpatient Hospital Stay (HOSPITAL_COMMUNITY): Payer: Medicare Other | Admitting: Anesthesiology

## 2015-09-09 ENCOUNTER — Inpatient Hospital Stay (HOSPITAL_COMMUNITY): Payer: Medicare Other

## 2015-09-09 ENCOUNTER — Encounter (HOSPITAL_COMMUNITY)
Admission: RE | Disposition: A | Discharge status: Home Health | Payer: Self-pay | Source: Ambulatory Visit | Attending: Orthopedic Surgery

## 2015-09-09 ENCOUNTER — Inpatient Hospital Stay (HOSPITAL_COMMUNITY)
Admission: RE | Admit: 2015-09-09 | Discharge: 2015-09-10 | DRG: 470 | Disposition: A | Discharge status: Home Health | Payer: Medicare Other | Source: Ambulatory Visit | Attending: Orthopedic Surgery | Admitting: Orthopedic Surgery

## 2015-09-09 DIAGNOSIS — Z87440 Personal history of urinary (tract) infections: Secondary | ICD-10-CM | POA: Diagnosis not present

## 2015-09-09 DIAGNOSIS — M1711 Unilateral primary osteoarthritis, right knee: Secondary | ICD-10-CM | POA: Diagnosis not present

## 2015-09-09 DIAGNOSIS — Z8619 Personal history of other infectious and parasitic diseases: Secondary | ICD-10-CM

## 2015-09-09 DIAGNOSIS — M25552 Pain in left hip: Secondary | ICD-10-CM | POA: Diagnosis not present

## 2015-09-09 DIAGNOSIS — Z87891 Personal history of nicotine dependence: Secondary | ICD-10-CM | POA: Diagnosis not present

## 2015-09-09 DIAGNOSIS — Z85828 Personal history of other malignant neoplasm of skin: Secondary | ICD-10-CM | POA: Diagnosis not present

## 2015-09-09 DIAGNOSIS — Z471 Aftercare following joint replacement surgery: Secondary | ICD-10-CM | POA: Diagnosis not present

## 2015-09-09 DIAGNOSIS — Z7982 Long term (current) use of aspirin: Secondary | ICD-10-CM | POA: Diagnosis not present

## 2015-09-09 DIAGNOSIS — Z96651 Presence of right artificial knee joint: Secondary | ICD-10-CM | POA: Diagnosis not present

## 2015-09-09 DIAGNOSIS — Z9841 Cataract extraction status, right eye: Secondary | ICD-10-CM

## 2015-09-09 DIAGNOSIS — Z79899 Other long term (current) drug therapy: Secondary | ICD-10-CM | POA: Diagnosis not present

## 2015-09-09 DIAGNOSIS — Z96649 Presence of unspecified artificial hip joint: Secondary | ICD-10-CM

## 2015-09-09 DIAGNOSIS — Z9842 Cataract extraction status, left eye: Secondary | ICD-10-CM

## 2015-09-09 DIAGNOSIS — Z96641 Presence of right artificial hip joint: Secondary | ICD-10-CM | POA: Diagnosis not present

## 2015-09-09 DIAGNOSIS — M169 Osteoarthritis of hip, unspecified: Secondary | ICD-10-CM | POA: Diagnosis present

## 2015-09-09 DIAGNOSIS — Z96642 Presence of left artificial hip joint: Secondary | ICD-10-CM | POA: Diagnosis not present

## 2015-09-09 DIAGNOSIS — M1612 Unilateral primary osteoarthritis, left hip: Principal | ICD-10-CM | POA: Diagnosis present

## 2015-09-09 HISTORY — PX: TOTAL HIP ARTHROPLASTY: SHX124

## 2015-09-09 LAB — TYPE AND SCREEN
ABO/RH(D): A POS
Antibody Screen: NEGATIVE

## 2015-09-09 SURGERY — ARTHROPLASTY, HIP, TOTAL, ANTERIOR APPROACH
Anesthesia: Monitor Anesthesia Care | Site: Hip | Laterality: Left

## 2015-09-09 MED ORDER — LACTATED RINGERS IV SOLN
INTRAVENOUS | Status: DC
Start: 2015-09-09 — End: 2015-09-09

## 2015-09-09 MED ORDER — ACETAMINOPHEN 500 MG PO TABS
1000.0000 mg | ORAL_TABLET | Freq: Four times a day (QID) | ORAL | Status: DC
  Administered 2015-09-09 – 2015-09-10 (×3): 1000 mg via ORAL
  Filled 2015-09-09 (×3): qty 2

## 2015-09-09 MED ORDER — HYDROMORPHONE HCL 1 MG/ML IJ SOLN: 0.2500 mg | INTRAMUSCULAR | Status: DC | PRN

## 2015-09-09 MED ORDER — METHOCARBAMOL 1000 MG/10ML IJ SOLN
500.0000 mg | Freq: Four times a day (QID) | INTRAMUSCULAR | Status: DC | PRN
  Administered 2015-09-09: 500 mg via INTRAVENOUS
  Filled 2015-09-09: qty 550
  Filled 2015-09-09: qty 5

## 2015-09-09 MED ORDER — SODIUM CHLORIDE 0.9 % IV SOLN
1000.0000 mg | Freq: Once | INTRAVENOUS | Status: AC
  Administered 2015-09-09: 1000 mg via INTRAVENOUS
  Filled 2015-09-09: qty 10

## 2015-09-09 MED ORDER — PHENOL 1.4 % MT LIQD: 1.0000 | OROMUCOSAL | Status: DC | PRN

## 2015-09-09 MED ORDER — FLEET ENEMA 7-19 GM/118ML RE ENEM: 1.0000 | ENEMA | Freq: Once | RECTAL | Status: DC | PRN

## 2015-09-09 MED ORDER — FENTANYL CITRATE (PF) 100 MCG/2ML IJ SOLN
INTRAMUSCULAR | Status: AC
Start: 2015-09-09 — End: 2015-09-09
  Filled 2015-09-09: qty 2

## 2015-09-09 MED ORDER — ONDANSETRON HCL 4 MG/2ML IJ SOLN
INTRAMUSCULAR | Status: DC | PRN
  Administered 2015-09-09: 4 mg via INTRAVENOUS

## 2015-09-09 MED ORDER — METOCLOPRAMIDE HCL 5 MG/ML IJ SOLN: 5.0000 mg | Freq: Three times a day (TID) | INTRAMUSCULAR | Status: DC | PRN

## 2015-09-09 MED ORDER — LACTATED RINGERS IV SOLN
INTRAVENOUS | Status: DC
  Administered 2015-09-09 (×2): via INTRAVENOUS

## 2015-09-09 MED ORDER — ONDANSETRON HCL 4 MG PO TABS: 4.0000 mg | ORAL_TABLET | Freq: Four times a day (QID) | ORAL | Status: DC | PRN

## 2015-09-09 MED ORDER — TRAMADOL HCL 50 MG PO TABS: 50.0000 mg | ORAL_TABLET | Freq: Four times a day (QID) | ORAL | 0 refills | Status: DC | PRN

## 2015-09-09 MED ORDER — DEXAMETHASONE SODIUM PHOSPHATE 10 MG/ML IJ SOLN: 10.0000 mg | Freq: Once | INTRAMUSCULAR | Status: DC

## 2015-09-09 MED ORDER — PROPOFOL 10 MG/ML IV BOLUS
INTRAVENOUS | Status: AC
  Filled 2015-09-09: qty 40

## 2015-09-09 MED ORDER — DOCUSATE SODIUM 100 MG PO CAPS
100.0000 mg | ORAL_CAPSULE | Freq: Two times a day (BID) | ORAL | Status: DC
  Administered 2015-09-09 – 2015-09-10 (×2): 100 mg via ORAL
  Filled 2015-09-09 (×2): qty 1

## 2015-09-09 MED ORDER — TRANEXAMIC ACID 1000 MG/10ML IV SOLN
1000.0000 mg | INTRAVENOUS | Status: AC
  Administered 2015-09-09: 1000 mg via INTRAVENOUS
  Filled 2015-09-09: qty 1100

## 2015-09-09 MED ORDER — PROPOFOL 10 MG/ML IV BOLUS
INTRAVENOUS | Status: AC
  Filled 2015-09-09: qty 20

## 2015-09-09 MED ORDER — ACETAMINOPHEN 10 MG/ML IV SOLN
INTRAVENOUS | Status: AC
  Filled 2015-09-09: qty 100

## 2015-09-09 MED ORDER — STERILE WATER FOR IRRIGATION IR SOLN
Status: DC | PRN
  Administered 2015-09-09: 3000 mL

## 2015-09-09 MED ORDER — ACETAMINOPHEN 650 MG RE SUPP: 650.0000 mg | Freq: Four times a day (QID) | RECTAL | Status: DC | PRN

## 2015-09-09 MED ORDER — FENTANYL CITRATE (PF) 100 MCG/2ML IJ SOLN
INTRAMUSCULAR | Status: DC | PRN
  Administered 2015-09-09: 100 ug via INTRAVENOUS

## 2015-09-09 MED ORDER — PHENYLEPHRINE HCL 10 MG/ML IJ SOLN
INTRAMUSCULAR | Status: DC | PRN
  Administered 2015-09-09: 30 ug/min via INTRAVENOUS

## 2015-09-09 MED ORDER — MORPHINE SULFATE (PF) 2 MG/ML IV SOLN: 1.0000 mg | INTRAVENOUS | Status: DC | PRN

## 2015-09-09 MED ORDER — PHENYLEPHRINE HCL 10 MG/ML IJ SOLN
INTRAMUSCULAR | Status: DC | PRN
  Administered 2015-09-09 (×5): 80 ug via INTRAVENOUS

## 2015-09-09 MED ORDER — ACETAMINOPHEN 10 MG/ML IV SOLN: 1000.0000 mg | Freq: Once | INTRAVENOUS | Status: DC

## 2015-09-09 MED ORDER — EPHEDRINE SULFATE 50 MG/ML IJ SOLN
INTRAMUSCULAR | Status: DC | PRN
  Administered 2015-09-09: 10 mg via INTRAVENOUS
  Administered 2015-09-09: 5 mg via INTRAVENOUS

## 2015-09-09 MED ORDER — CEFAZOLIN SODIUM-DEXTROSE 2-4 GM/100ML-% IV SOLN
INTRAVENOUS | Status: AC
  Filled 2015-09-09: qty 100

## 2015-09-09 MED ORDER — DIPHENHYDRAMINE HCL 12.5 MG/5ML PO ELIX: 12.5000 mg | ORAL_SOLUTION | ORAL | Status: DC | PRN

## 2015-09-09 MED ORDER — OXYCODONE HCL 5 MG PO TABS
5.0000 mg | ORAL_TABLET | ORAL | Status: DC | PRN
  Administered 2015-09-10 (×2): 5 mg via ORAL
  Filled 2015-09-09 (×2): qty 1

## 2015-09-09 MED ORDER — ONDANSETRON HCL 4 MG/2ML IJ SOLN: 4.0000 mg | Freq: Four times a day (QID) | INTRAMUSCULAR | Status: DC | PRN

## 2015-09-09 MED ORDER — POLYETHYLENE GLYCOL 3350 17 G PO PACK: 17.0000 g | PACK | Freq: Every day | ORAL | Status: DC | PRN

## 2015-09-09 MED ORDER — CEFAZOLIN SODIUM-DEXTROSE 2-4 GM/100ML-% IV SOLN
2.0000 g | INTRAVENOUS | Status: AC
  Administered 2015-09-09: 2 g via INTRAVENOUS
  Filled 2015-09-09: qty 100

## 2015-09-09 MED ORDER — RIVAROXABAN 10 MG PO TABS
10.0000 mg | ORAL_TABLET | Freq: Every day | ORAL | Status: DC
  Administered 2015-09-10: 10 mg via ORAL
  Filled 2015-09-09: qty 1

## 2015-09-09 MED ORDER — CEFAZOLIN SODIUM-DEXTROSE 2-4 GM/100ML-% IV SOLN
2.0000 g | Freq: Four times a day (QID) | INTRAVENOUS | Status: AC
  Administered 2015-09-09 (×2): 2 g via INTRAVENOUS
  Filled 2015-09-09: qty 100

## 2015-09-09 MED ORDER — BUPIVACAINE HCL (PF) 0.25 % IJ SOLN
INTRAMUSCULAR | Status: DC | PRN
  Administered 2015-09-09: 30 mL

## 2015-09-09 MED ORDER — METHOCARBAMOL 500 MG PO TABS
500.0000 mg | ORAL_TABLET | Freq: Four times a day (QID) | ORAL | Status: DC | PRN
Start: 2015-09-09 — End: 2015-09-10

## 2015-09-09 MED ORDER — RIVAROXABAN 10 MG PO TABS: 10.0000 mg | ORAL_TABLET | Freq: Every day | ORAL | 0 refills | Status: DC

## 2015-09-09 MED ORDER — ACETAMINOPHEN 10 MG/ML IV SOLN
1000.0000 mg | Freq: Once | INTRAVENOUS | Status: AC
  Administered 2015-09-09: 1000 mg via INTRAVENOUS
  Filled 2015-09-09: qty 100

## 2015-09-09 MED ORDER — ONDANSETRON HCL 4 MG/2ML IJ SOLN
INTRAMUSCULAR | Status: AC
  Filled 2015-09-09: qty 2

## 2015-09-09 MED ORDER — TRAMADOL HCL 50 MG PO TABS: 50.0000 mg | ORAL_TABLET | Freq: Four times a day (QID) | ORAL | Status: DC | PRN

## 2015-09-09 MED ORDER — METOCLOPRAMIDE HCL 5 MG PO TABS: 5.0000 mg | ORAL_TABLET | Freq: Three times a day (TID) | ORAL | Status: DC | PRN

## 2015-09-09 MED ORDER — CHLORHEXIDINE GLUCONATE 4 % EX LIQD: 60.0000 mL | Freq: Once | CUTANEOUS | Status: DC

## 2015-09-09 MED ORDER — ACETAMINOPHEN 325 MG PO TABS: 650.0000 mg | ORAL_TABLET | Freq: Four times a day (QID) | ORAL | Status: DC | PRN

## 2015-09-09 MED ORDER — METHOCARBAMOL 500 MG PO TABS: 500.0000 mg | ORAL_TABLET | Freq: Four times a day (QID) | ORAL | 0 refills | Status: DC | PRN

## 2015-09-09 MED ORDER — PROPOFOL 500 MG/50ML IV EMUL
INTRAVENOUS | Status: DC | PRN
  Administered 2015-09-09: 50 ug/kg/min via INTRAVENOUS

## 2015-09-09 MED ORDER — SODIUM CHLORIDE 0.9 % IV SOLN
INTRAVENOUS | Status: DC
  Administered 2015-09-09: 17:00:00 via INTRAVENOUS

## 2015-09-09 MED ORDER — BUPIVACAINE IN DEXTROSE 0.75-8.25 % IT SOLN
INTRATHECAL | Status: DC | PRN
  Administered 2015-09-09: 12 mg via INTRATHECAL

## 2015-09-09 MED ORDER — DEXAMETHASONE SODIUM PHOSPHATE 10 MG/ML IJ SOLN
10.0000 mg | Freq: Once | INTRAMUSCULAR | Status: AC
  Administered 2015-09-09: 10 mg via INTRAVENOUS

## 2015-09-09 MED ORDER — OXYCODONE HCL 5 MG PO TABS: 5.0000 mg | ORAL_TABLET | ORAL | 0 refills | Status: DC | PRN

## 2015-09-09 MED ORDER — 0.9 % SODIUM CHLORIDE (POUR BTL) OPTIME
TOPICAL | Status: DC | PRN
  Administered 2015-09-09: 1000 mL

## 2015-09-09 MED ORDER — BUPIVACAINE HCL (PF) 0.75 % IJ SOLN
INTRAMUSCULAR | Status: DC | PRN
  Administered 2015-09-09: 1.6 mL via INTRATHECAL

## 2015-09-09 MED ORDER — ROSUVASTATIN CALCIUM 5 MG PO TABS
10.0000 mg | ORAL_TABLET | Freq: Every day | ORAL | Status: DC
  Administered 2015-09-09: 10 mg via ORAL
  Filled 2015-09-09: qty 2

## 2015-09-09 MED ORDER — BISACODYL 10 MG RE SUPP: 10.0000 mg | Freq: Every day | RECTAL | Status: DC | PRN

## 2015-09-09 MED ORDER — MENTHOL 3 MG MT LOZG: 1.0000 | LOZENGE | OROMUCOSAL | Status: DC | PRN

## 2015-09-09 MED ORDER — BUPIVACAINE HCL (PF) 0.25 % IJ SOLN
INTRAMUSCULAR | Status: AC
  Filled 2015-09-09: qty 30

## 2015-09-09 SURGICAL SUPPLY — 33 items
BAG DECANTER FOR FLEXI CONT (MISCELLANEOUS) ×2 IMPLANT
BAG ZIPLOCK 12X15 (MISCELLANEOUS) IMPLANT
BLADE SAG 18X100X1.27 (BLADE) ×2 IMPLANT
CAPT HIP TOTAL 2 ×2 IMPLANT
CLOTH BEACON ORANGE TIMEOUT ST (SAFETY) ×2 IMPLANT
COVER PERINEAL POST (MISCELLANEOUS) ×2 IMPLANT
DECANTER SPIKE VIAL GLASS SM (MISCELLANEOUS) IMPLANT
DRAPE STERI IOBAN 125X83 (DRAPES) ×2 IMPLANT
DRAPE U-SHAPE 47X51 STRL (DRAPES) ×4 IMPLANT
DRSG ADAPTIC 3X8 NADH LF (GAUZE/BANDAGES/DRESSINGS) ×2 IMPLANT
DRSG MEPILEX BORDER 4X4 (GAUZE/BANDAGES/DRESSINGS) ×2 IMPLANT
DRSG MEPILEX BORDER 4X8 (GAUZE/BANDAGES/DRESSINGS) ×2 IMPLANT
DURAPREP 26ML APPLICATOR (WOUND CARE) ×2 IMPLANT
ELECT REM PT RETURN 9FT ADLT (ELECTROSURGICAL) ×2
ELECTRODE REM PT RTRN 9FT ADLT (ELECTROSURGICAL) ×1 IMPLANT
EVACUATOR 1/8 PVC DRAIN (DRAIN) ×2 IMPLANT
GLOVE BIO SURGEON STRL SZ7.5 (GLOVE) ×2 IMPLANT
GLOVE BIO SURGEON STRL SZ8 (GLOVE) ×4 IMPLANT
GLOVE BIOGEL PI IND STRL 8 (GLOVE) ×7 IMPLANT
GLOVE BIOGEL PI INDICATOR 8 (GLOVE) ×7
GOWN STRL REUS W/TWL LRG LVL3 (GOWN DISPOSABLE) ×8 IMPLANT
GOWN STRL REUS W/TWL XL LVL3 (GOWN DISPOSABLE) ×2 IMPLANT
PACK ANTERIOR HIP CUSTOM (KITS) ×2 IMPLANT
STRIP CLOSURE SKIN 1/2X4 (GAUZE/BANDAGES/DRESSINGS) ×2 IMPLANT
SUT ETHIBOND NAB CT1 #1 30IN (SUTURE) ×2 IMPLANT
SUT MNCRL AB 4-0 PS2 18 (SUTURE) ×2 IMPLANT
SUT VIC AB 2-0 CT1 27 (SUTURE) ×2
SUT VIC AB 2-0 CT1 TAPERPNT 27 (SUTURE) ×2 IMPLANT
SUT VLOC 180 0 24IN GS25 (SUTURE) ×2 IMPLANT
SYR 50ML LL SCALE MARK (SYRINGE) IMPLANT
TRAY FOLEY W/METER SILVER 14FR (SET/KITS/TRAYS/PACK) ×2 IMPLANT
TRAY FOLEY W/METER SILVER 16FR (SET/KITS/TRAYS/PACK) IMPLANT
YANKAUER SUCT BULB TIP 10FT TU (MISCELLANEOUS) ×2 IMPLANT

## 2015-09-09 NOTE — Discharge Instructions (Addendum)
° °Dr. Frank Aluisio °Total Joint Specialist °[facility] °[address] °, [place] [address] °[phone] ° °ANTERIOR APPROACH TOTAL HIP REPLACEMENT POSTOPERATIVE DIRECTIONS ° ° °Hip Rehabilitation, Guidelines Following Surgery  °The results of a hip operation are greatly improved after range of motion and muscle strengthening exercises. Follow all safety measures which are given to protect your hip. If any of these exercises cause increased pain or swelling in your joint, decrease the amount until you are comfortable again. Then slowly increase the exercises. Call your caregiver if you have problems or questions.  ° °HOME CARE INSTRUCTIONS  °Remove items at home which could result in a fall. This includes throw rugs or furniture in walking pathways.  °· ICE to the affected hip every three hours for 30 minutes at a time and then as needed for pain and swelling.  Continue to use ice on the hip for pain and swelling from surgery. You may notice swelling that will progress down to the foot and ankle.  This is normal after surgery.  Elevate the leg when you are not up walking on it.   °· Continue to use the breathing machine which will help keep your temperature down.  It is common for your temperature to cycle up and down following surgery, especially at night when you are not up moving around and exerting yourself.  The breathing machine keeps your lungs expanded and your temperature down. ° ° °DIET °You may resume your previous home diet once your are discharged from the hospital. ° °DRESSING / WOUND CARE / SHOWERING °You may shower 3 days after surgery, but keep the wounds dry during showering.  You may use an occlusive plastic wrap (Press'n Seal for example), NO SOAKING/SUBMERGING IN THE BATHTUB.  If the bandage gets wet, change with a clean dry gauze.  If the incision gets wet, pat the wound dry with a clean towel. °You may start showering once you are discharged home but do not  submerge the incision under water. Just pat the incision dry and apply a dry gauze dressing on daily. °Change the surgical dressing daily and reapply a dry dressing each time. ° °ACTIVITY °Walk with your walker as instructed. °Use walker as long as suggested by your caregivers. °Avoid periods of inactivity such as sitting longer than an hour when not asleep. This helps prevent blood clots.  °You may resume a sexual relationship in one month or when given the OK by your doctor.  °You may return to work once you are cleared by your doctor.  °Do not drive a car for 6 weeks or until released by you surgeon.  °Do not drive while taking narcotics. ° °WEIGHT BEARING °Weight bearing as tolerated with assist device (walker, cane, etc) as directed, use it as long as suggested by your surgeon or therapist, typically at least 4-6 weeks. ° °POSTOPERATIVE CONSTIPATION PROTOCOL °Constipation - defined medically as fewer than three stools per week and severe constipation as less than one stool per week. ° °One of the most common issues patients have following surgery is constipation.  Even if you have a regular bowel pattern at home, your normal regimen is likely to be disrupted due to multiple reasons following surgery.  Combination of anesthesia, postoperative narcotics, change in appetite and fluid intake all can affect your bowels.  In order to avoid complications following surgery, here are some recommendations in order to help you during your recovery period. ° °Colace (docusate) - Pick up an over-the-counter   form of Colace or another stool softener and take twice a day as long as you are requiring postoperative pain medications.  Take with a full glass of water daily.  If you experience loose stools or diarrhea, hold the colace until you stool forms back up.  If your symptoms do not get better within 1 week or if they get worse, check with your doctor. ° °Dulcolax (bisacodyl) - Pick up over-the-counter and take as directed  by the product packaging as needed to assist with the movement of your bowels.  Take with a full glass of water.  Use this product as needed if not relieved by Colace only.  ° °MiraLax (polyethylene glycol) - Pick up over-the-counter to have on hand.  MiraLax is a solution that will increase the amount of water in your bowels to assist with bowel movements.  Take as directed and can mix with a glass of water, juice, soda, coffee, or tea.  Take if you go more than two days without a movement. °Do not use MiraLax more than once per day. Call your doctor if you are still constipated or irregular after using this medication for 7 days in a row. ° °If you continue to have problems with postoperative constipation, please contact the office for further assistance and recommendations.  If you experience "the worst abdominal pain ever" or develop nausea or vomiting, please contact the office immediatly for further recommendations for treatment. ° °ITCHING ° If you experience itching with your medications, try taking only a single pain pill, or even half a pain pill at a time.  You can also use Benadryl over the counter for itching or also to help with sleep.  ° °TED HOSE STOCKINGS °Wear the elastic stockings on both legs for three weeks following surgery during the day but you may remove then at night for sleeping. ° °MEDICATIONS °See your medication summary on the “After Visit Summary” that the nursing staff will review with you prior to discharge.  You may have some home medications which will be placed on hold until you complete the course of blood thinner medication.  It is important for you to complete the blood thinner medication as prescribed by your surgeon.  Continue your approved medications as instructed at time of discharge. ° °PRECAUTIONS °If you experience chest pain or shortness of breath - call 911 immediately for transfer to the hospital emergency department.  °If you develop a fever greater that 101 F,  purulent drainage from wound, increased redness or drainage from wound, foul odor from the wound/dressing, or calf pain - CONTACT YOUR SURGEON.   °                                                °FOLLOW-UP APPOINTMENTS °Make sure you keep all of your appointments after your operation with your surgeon and caregivers. You should call the office at the above phone number and make an appointment for approximately two weeks after the date of your surgery or on the date instructed by your surgeon outlined in the "After Visit Summary". ° °RANGE OF MOTION AND STRENGTHENING EXERCISES  °These exercises are designed to help you keep full movement of your hip joint. Follow your caregiver's or physical therapist's instructions. Perform all exercises about fifteen times, three times per day or as directed. Exercise both hips, even if you   have had only one joint replacement. These exercises can be done on a training (exercise) mat, on the floor, on a table or on a bed. Use whatever works the best and is most comfortable for you. Use music or television while you are exercising so that the exercises are a pleasant break in your day. This will make your life better with the exercises acting as a break in routine you can look forward to.  Lying on your back, slowly slide your foot toward your buttocks, raising your knee up off the floor. Then slowly slide your foot back down until your leg is straight again.  Lying on your back spread your legs as far apart as you can without causing discomfort.  Lying on your side, raise your upper leg and foot straight up from the floor as far as is comfortable. Slowly lower the leg and repeat.  Lying on your back, tighten up the muscle in the front of your thigh (quadriceps muscles). You can do this by keeping your leg straight and trying to raise your heel off the floor. This helps strengthen the largest muscle supporting your knee.  Lying on your back, tighten up the muscles of your  buttocks both with the legs straight and with the knee bent at a comfortable angle while keeping your heel on the floor.   IF YOU ARE TRANSFERRED TO A SKILLED REHAB FACILITY If the patient is transferred to a skilled rehab facility following release from the hospital, a list of the current medications will be sent to the facility for the patient to continue.  When discharged from the skilled rehab facility, please have the facility set up the patient's Dietrich prior to being released. Also, the skilled facility will be responsible for providing the patient with their medications at time of release from the facility to include their pain medication, the muscle relaxants, and their blood thinner medication. If the patient is still at the rehab facility at time of the two week follow up appointment, the skilled rehab facility will also need to assist the patient in arranging follow up appointment in our office and any transportation needs.  MAKE SURE YOU:  Understand these instructions.  Get help right away if you are not doing well or get worse.    Pick up stool softner and laxative for home use following surgery while on pain medications. Do not submerge incision under water. Please use good hand washing techniques while changing dressing each day. May shower starting three days after surgery. Please use a clean towel to pat the incision dry following showers. Continue to use ice for pain and swelling after surgery. Do not use any lotions or creams on the incision until instructed by your surgeon.  Take Xarelto for two and a half more weeks, then discontinue Xarelto. Once the patient has completed the blood thinner regimen, then take a Baby 81 mg Aspirin daily for three more weeks.  Information on my medicine - XARELTO (Rivaroxaban)  This medication education was reviewed with me or my healthcare representative as part of my discharge preparation.  The pharmacist that  spoke with me during my hospital stay was: Altha Harm   Why was Xarelto prescribed for you? Xarelto was prescribed for you to reduce the risk of blood clots forming after orthopedic surgery. The medical term for these abnormal blood clots is venous thromboembolism (VTE).  What do you need to know about xarelto ? Take your Xarelto ONCE DAILY at the  same time every day. You may take it either with or without food.  If you have difficulty swallowing the tablet whole, you may crush it and mix in applesauce just prior to taking your dose.  Take Xarelto exactly as prescribed by your doctor and DO NOT stop taking Xarelto without talking to the doctor who prescribed the medication.  Stopping without other VTE prevention medication to take the place of Xarelto may increase your risk of developing a clot.  After discharge, you should have regular check-up appointments with your healthcare provider that is prescribing your Xarelto.    What do you do if you miss a dose? If you miss a dose, take it as soon as you remember on the same day then continue your regularly scheduled once daily regimen the next day. Do not take two doses of Xarelto on the same day.   Important Safety Information A possible side effect of Xarelto is bleeding. You should call your healthcare provider right away if you experience any of the following: ? Bleeding from an injury or your nose that does not stop. ? Unusual colored urine (red or dark brown) or unusual colored stools (red or black). ? Unusual bruising for unknown reasons. ? A serious fall or if you hit your head (even if there is no bleeding).  Some medicines may interact with Xarelto and might increase your risk of bleeding while on Xarelto. To help avoid this, consult your healthcare provider or pharmacist prior to using any new prescription or non-prescription medications, including herbals, vitamins, non-steroidal anti-inflammatory drugs (NSAIDs) and  supplements.  This website has more information on Xarelto: [email or web address]

## 2015-09-09 NOTE — Anesthesia Preprocedure Evaluation (Addendum)
Anesthesia Evaluation  Patient identified by MRN, date of birth, ID band Patient awake    Reviewed: Allergy & Precautions, H&P , NPO status , Patient's Chart, lab work & pertinent test results  Airway Mallampati: II  TM Distance: >3 FB Neck ROM: Full    Dental no notable dental hx. (+) Teeth Intact, Dental Advisory Given   Pulmonary neg pulmonary ROS, former smoker,    Pulmonary exam normal breath sounds clear to auscultation       Cardiovascular negative cardio ROS   Rhythm:Regular Rate:Normal     Neuro/Psych negative neurological ROS  negative psych ROS   GI/Hepatic negative GI ROS, Neg liver ROS,   Endo/Other  negative endocrine ROS  Renal/GU negative Renal ROS  negative genitourinary   Musculoskeletal  (+) Arthritis , Osteoarthritis,    Abdominal   Peds  Hematology negative hematology ROS (+)   Anesthesia Other Findings   Reproductive/Obstetrics negative OB ROS                            Anesthesia Physical Anesthesia Plan  ASA: II  Anesthesia Plan: MAC and Spinal   Post-op Pain Management:    Induction: Intravenous  Airway Management Planned: Simple Face Mask  Additional Equipment:   Intra-op Plan:   Post-operative Plan:   Informed Consent: I have reviewed the patients History and Physical, chart, labs and discussed the procedure including the risks, benefits and alternatives for the proposed anesthesia with the patient or authorized representative who has indicated his/her understanding and acceptance.   Dental advisory given  Plan Discussed with: CRNA  Anesthesia Plan Comments:         Anesthesia Quick Evaluation

## 2015-09-09 NOTE — Anesthesia Postprocedure Evaluation (Signed)
Anesthesia Post Note  Patient: Jamie Galloway  Procedure(s) Performed: Procedure(s) (LRB): LEFT TOTAL HIP ARTHROPLASTY ANTERIOR APPROACH (Left)  Patient location during evaluation: PACU Anesthesia Type: Spinal and MAC Level of consciousness: awake and alert Pain management: pain level controlled Vital Signs Assessment: post-procedure vital signs reviewed and stable Respiratory status: spontaneous breathing and respiratory function stable Cardiovascular status: blood pressure returned to baseline and stable Postop Assessment: spinal receding Anesthetic complications: no    Last Vitals:  Vitals:   09/09/15 1300 09/09/15 1315  BP: 110/64 (!) 97/58  Pulse: 78 86  Resp: 16 19  Temp: 36.6 C     Last Pain:  Vitals:   09/09/15 1125  TempSrc:   PainSc: 0-No pain                 Noralyn Karim,W. EDMOND

## 2015-09-09 NOTE — Anesthesia Procedure Notes (Signed)
Spinal  Patient location during procedure: OR Start time: 09/09/2015 9:48 AM End time: 09/09/2015 9:52 AM Staffing Anesthesiologist: Roderic Palau Performed: anesthesiologist  Preanesthetic Checklist Completed: patient identified, surgical consent, pre-op evaluation, timeout performed, IV checked, risks and benefits discussed and monitors and equipment checked Spinal Block Patient position: sitting Prep: Betadine Patient monitoring: cardiac monitor, continuous pulse ox and blood pressure Approach: midline Location: L3-4 Injection technique: single-shot Needle Needle type: Sprotte  Needle gauge: 22 G Needle length: 9 cm Assessment Sensory level: T8 Additional Notes Functioning IV was confirmed and monitors were applied. Sterile prep and drape, including hand hygiene and sterile gloves were used. The patient was positioned and the spine was prepped. The skin was anesthetized with lidocaine.  Free flow of clear CSF was obtained prior to injecting local anesthetic into the CSF.  The spinal needle aspirated freely following injection.  The needle was carefully withdrawn.  The patient tolerated the procedure well.

## 2015-09-09 NOTE — Evaluation (Signed)
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  Pt should progress to dc home with assist of family    Follow Up Recommendations No PT follow up    Equipment Recommendations  None recommended by PT    Recommendations for Other Services       Precautions / Restrictions Precautions Precautions: Fall Restrictions Weight Bearing Restrictions: No      Mobility  Bed Mobility Overal bed mobility: Needs Assistance Bed Mobility: Supine to Sit     Supine to sit: Min assist     General bed mobility comments: min cues for self assist with R LE  Transfers Overall transfer level: Needs assistance Equipment used: Rolling walker (2 wheeled) Transfers: Sit to/from Stand Sit to Stand: Min assist         General transfer comment: cues for LE management and use of UEs to self assist  Ambulation/Gait Ambulation/Gait assistance: Min assist Ambulation Distance (Feet): 100 Feet Assistive device: Rolling walker (2 wheeled) Gait Pattern/deviations: Step-through pattern;Shuffle;Trunk flexed Gait velocity: decr   General Gait Details: cues for posture and position from [employer]            Wheelchair Mobility    Modified Rankin (Stroke Patients Only)       Balance                                             Pertinent Vitals/Pain Pain Assessment: 0-10 Pain Score: 2  Pain Location: L hip Pain Descriptors / Indicators: Aching;Sore Pain Intervention(s): Limited activity within patient's tolerance;Monitored during session;Premedicated before session;Ice applied    Home Living Family/patient expects to be discharged to:: Private residence Living Arrangements: Spouse/significant  other Available Help at Discharge: Family Type of Home: House Home Access: [place]: Able to live on main level with bedroom/bathroom Home Equipment: [place] - 2 wheels;Shower seat      Prior Function Level of Independence: Independent with assistive device(s)         Comments: using RW due to pain     Hand Dominance   Dominant Hand: Right    Extremity/Trunk Assessment   Upper Extremity Assessment: Overall WFL for tasks assessed           Lower Extremity Assessment: LLE deficits/detail      Cervical / Trunk Assessment: Normal  Communication   Communication: No difficulties  Cognition Arousal/Alertness: Awake/alert Behavior During Therapy: WFL for tasks assessed/performed Overall Cognitive Status: Within Functional Limits for tasks assessed                      General Comments      Exercises        Assessment/Plan    PT Assessment Patient needs continued PT services  PT Diagnosis Difficulty walking   PT Problem List Decreased strength;Decreased range of motion;Decreased activity tolerance;Decreased mobility;Decreased knowledge of use of DME;Pain  PT Treatment Interventions DME instruction;Gait training;Stair training;Functional mobility training;Therapeutic activities;Therapeutic exercise;Patient/family education   PT Goals (Current goals can be found in the Care Plan section) Acute Rehab PT Goals Patient  Stated Goal: Regain IND and not have to come back for a TKR on the L PT Goal Formulation: With patient Time For Goal Achievement: 09/12/15 Potential to Achieve Goals: Good    Frequency 7X/week   Barriers to discharge        Co-evaluation               End of Session Equipment Utilized During Treatment: Gait belt Activity Tolerance: Patient tolerated treatment well Patient left: in chair;with call bell/phone within reach;with family/visitor present Nurse Communication: Mobility status         Time:  1530-1600 PT Time Calculation (min) (ACUTE ONLY): 30 min   Charges:   PT Evaluation $PT Eval Low Complexity: 1 Procedure PT Treatments $Gait Training: 8-22 mins   PT G Codes:        Earon Rivest Sep 17, 2015, 4:23 PM

## 2015-09-09 NOTE — Transfer of Care (Signed)
Immediate Anesthesia Transfer of Care Note  Patient: Jamie Galloway  Procedure(s) Performed: Procedure(s): LEFT TOTAL HIP ARTHROPLASTY ANTERIOR APPROACH (Left)  Patient Location: PACU  Anesthesia Type:Spinal  Level of Consciousness: awake, alert  and oriented  Airway & Oxygen Therapy: Patient Spontanous Breathing and Patient connected to face mask oxygen  Post-op Assessment: Report given to RN and Post -op Vital signs reviewed and stable  Post vital signs: Reviewed and stable  Last Vitals:  Vitals:   09/09/15 0622  BP: (!) 152/73  Pulse: 86  Resp: 16  Temp: 36.7 C    Last Pain:  Vitals:   09/09/15 0622  TempSrc: Oral         Complications: No apparent anesthesia complications

## 2015-09-09 NOTE — Op Note (Signed)
OPERATIVE REPORT- TOTAL HIP ARTHROPLASTY   PREOPERATIVE DIAGNOSIS: Osteoarthritis of the Left hip.   POSTOPERATIVE DIAGNOSIS: Osteoarthritis of the Left  hip.   PROCEDURE: Left total hip arthroplasty, anterior approach.   SURGEON: Gaynelle Arabian, MD   ASSISTANT: Arlee Muslim, PA-C  ANESTHESIA:  Spinal  ESTIMATED BLOOD LOSS:-350 ml    DRAINS: Hemovac x1.   COMPLICATIONS: None   CONDITION: PACU - hemodynamically stable.   BRIEF CLINICAL NOTE: Jamie Galloway is a 75 y.o. female who has advanced end-  stage arthritis of their Left  hip with progressively worsening pain and  dysfunction.The patient has failed nonoperative management and presents for  total hip arthroplasty.   PROCEDURE IN DETAIL: After successful administration of spinal  anesthetic, the traction boots for the [facility] bed were placed on both  feet and the patient was placed onto the [facility] bed, boots placed into the leg  holders. The Left hip was then isolated from the perineum with plastic  drapes and prepped and draped in the usual sterile fashion. ASIS and  greater trochanter were marked and a oblique incision was made, starting  at about 1 cm lateral and 2 cm distal to the ASIS and coursing towards  the anterior cortex of the femur. The skin was cut with a 10 blade  through subcutaneous tissue to the level of the fascia overlying the  tensor fascia lata muscle. The fascia was then incised in line with the  incision at the junction of the anterior third and posterior 2/3rd. The  muscle was teased off the fascia and then the interval between the TFL  and the rectus was developed. The Hohmann retractor was then placed at  the top of the femoral neck over the capsule. The vessels overlying the  capsule were cauterized and the fat on top of the capsule was removed.  A Hohmann retractor was then placed anterior underneath the rectus  femoris to give exposure to the entire anterior capsule. A T-shaped   capsulotomy was performed. The edges were tagged and the femoral head  was identified.       Osteophytes are removed off the superior acetabulum.  The femoral neck was then cut in situ with an oscillating saw. Traction  was then applied to the left lower extremity utilizing the [facility]  traction. The femoral head was then removed. Retractors were placed  around the acetabulum and then circumferential removal of the labrum was  performed. Osteophytes were also removed. Reaming starts at 45 mm to  medialize and  Increased in 2 mm increments to 49 mm. We reamed in  approximately 40 degrees of abduction, 20 degrees anteversion. A 50 mm  pinnacle acetabular shell was then impacted in anatomic position under  fluoroscopic guidance with excellent purchase. We did not need to place  any additional dome screws. A 32 mm neutral + 4 marathon liner was then  placed into the acetabular shell.       The femoral lift was then placed along the lateral aspect of the femur  just distal to the vastus ridge. The leg was  externally rotated and capsule  was stripped off the inferior aspect of the femoral neck down to the  level of the lesser trochanter, this was done with electrocautery. The femur was lifted after this was performed. The  leg was then placed in an extended and adducted position essentially delivering the femur. We also removed the capsule superiorly and the piriformis from the piriformis  fossa to gain excellent exposure of the  proximal femur. Rongeur was used to remove some cancellous bone to get  into the lateral portion of the proximal femur for placement of the  initial starter reamer. The starter broaches was placed  the starter broach  and was shown to go down the center of the canal. Broaching  with the  Corail system was then performed starting at size 8, coursing  Up to size 14. A size 14 had excellent torsional and rotational  and axial stability. The trial standard offset neck was then  placed  with a 32 + 5 trial head. The hip was then reduced. We confirmed that  the stem was in the canal both on AP and lateral x-rays. It also has excellent sizing. The hip was reduced with outstanding stability through full extension and full external rotation.. AP pelvis was taken and the leg lengths were measured and found to be equal. Hip was then dislocated again and the femoral head and neck removed. The  femoral broach was removed. Size 14 Corail stem with a standard offset  neck was then impacted into the femur following native anteversion. Has  excellent purchase in the canal. Excellent torsional and rotational and  axial stability. It is confirmed to be in the canal on AP and lateral  fluoroscopic views. The 32 + 5 ceramic head was placed and the hip  reduced with outstanding stability. Again AP pelvis was taken and it  confirmed that the leg lengths were equal. The wound was then copiously  irrigated with saline solution and the capsule reattached and repaired  with Ethibond suture. 30 ml of .25% Bupivicaine was  injected into the capsule and into the edge of the tensor fascia lata as well as subcutaneous tissue. The fascia overlying the tensor fascia lata was then closed with a running #1 V-Loc. Subcu was closed with interrupted 2-0 Vicryl and subcuticular running 4-0 Monocryl. Incision was cleaned  and dried. Steri-Strips and a bulky sterile dressing applied. Hemovac  drain was hooked to suction and then the patient was awakened and transported to  recovery in stable condition.        Please note that a surgical assistant was a medical necessity for this procedure to perform it in a safe and expeditious manner. Assistant was necessary to provide appropriate retraction of vital neurovascular structures and to prevent femoral fracture and allow for anatomic placement of the prosthesis.  Gaynelle Arabian, M.D.

## 2015-09-10 LAB — BASIC METABOLIC PANEL
Anion gap: 3 — ABNORMAL LOW (ref 5–15)
BUN: 12 mg/dL (ref 6–20)
CHLORIDE: 109 mmol/L (ref 101–111)
CO2: 27 mmol/L (ref 22–32)
Calcium: 8.5 mg/dL — ABNORMAL LOW (ref 8.9–10.3)
Creatinine, Ser: 0.62 mg/dL (ref 0.44–1.00)
GFR calc Af Amer: >60 mL/min (ref >60)
GFR calc non Af Amer: >60 mL/min (ref >60)
Glucose, Bld: 120 mg/dL — ABNORMAL HIGH (ref 65–99)
POTASSIUM: 4.9 mmol/L (ref 3.5–5.1)
SODIUM: 139 mmol/L (ref 135–145)

## 2015-09-10 LAB — CBC
HEMATOCRIT: 34.3 % — AB (ref 36.0–46.0)
HEMOGLOBIN: 12.2 g/dL (ref 12.0–15.0)
MCH: 30.4 pg (ref 26.0–34.0)
MCHC: 35.6 g/dL (ref 30.0–36.0)
MCV: 85.5 fL (ref 78.0–100.0)
Platelets: 223 10*3/uL (ref 150–400)
RBC: 4.01 MIL/uL (ref 3.87–5.11)
RDW: 14 % (ref 11.5–15.5)
WBC: 12.7 10*3/uL — ABNORMAL HIGH (ref 4.0–10.5)

## 2015-09-10 MED ORDER — OXYCODONE HCL 5 MG PO TABS: 5.0000 mg | ORAL_TABLET | ORAL | 0 refills | Status: AC | PRN

## 2015-09-10 MED ORDER — METHOCARBAMOL 500 MG PO TABS: 500.0000 mg | ORAL_TABLET | Freq: Four times a day (QID) | ORAL | 0 refills | Status: AC | PRN

## 2015-09-10 MED ORDER — RIVAROXABAN 10 MG PO TABS: 10.0000 mg | ORAL_TABLET | Freq: Every day | ORAL | 0 refills | Status: AC

## 2015-09-10 MED ORDER — TRAMADOL HCL 50 MG PO TABS: 50.0000 mg | ORAL_TABLET | Freq: Four times a day (QID) | ORAL | 1 refills | Status: AC | PRN

## 2015-09-10 NOTE — Progress Notes (Signed)
Physical Therapy Treatment Patient Details Name: Jamie Galloway MRN: [id number] DOB: 05-25-1940 Today's Date: 09/10/2015    History of Present Illness Pt s/p L THR with hx of R THR and R TKR    PT Comments    Pt progressing well with mobility and states feels comfortable with dc to home and to follow through on home therex program from previous recent THR.  Follow Up Recommendations        Equipment Recommendations  None recommended by PT    Recommendations for Other Services       Precautions / Restrictions Precautions Precautions: Fall Restrictions Weight Bearing Restrictions: No    Mobility  Bed Mobility Overal bed mobility: Needs Assistance Bed Mobility: Supine to Sit;Sit to Supine     Supine to sit: Supervision Sit to supine: Supervision   General bed mobility comments: min cues for self assist with R LE  Transfers Overall transfer level: Needs assistance Equipment used: Rolling walker (2 wheeled) Transfers: Sit to/from Stand Sit to Stand: Supervision         General transfer comment: cues for LE management and use of UEs to self assist  Ambulation/Gait Ambulation/Gait assistance: Min guard;Supervision Ambulation Distance (Feet): 450 Feet Assistive device: Rolling walker (2 wheeled) Gait Pattern/deviations: Step-through pattern;Shuffle;Trunk flexed Gait velocity: decr   General Gait Details: cues for posture and position from [employer]            Wheelchair Mobility    Modified Rankin (Stroke Patients Only)       Balance                                    Cognition Arousal/Alertness: Awake/alert Behavior During Therapy: WFL for tasks assessed/performed Overall Cognitive Status: Within Functional Limits for tasks assessed                      Exercises Total Joint Exercises Ankle Circles/Pumps: AROM;Both;15 reps;Supine Quad Sets: AROM;Both;10 reps;Supine Heel Slides: AAROM;Left;20 reps;Supine Hip  ABduction/ADduction: AAROM;Left;15 reps;Supine Long Arc Quad: AROM;Left;10 reps;Seated    General Comments        Pertinent Vitals/Pain Pain Assessment: 0-10 Pain Score: 3  Pain Location: L hip Pain Descriptors / Indicators: Aching;Sore Pain Intervention(s): Limited activity within patient's tolerance;Monitored during session;Premedicated before session;Ice applied    Home Living                      Prior Function            PT Goals (current goals can now be found in the care plan section) Acute Rehab PT Goals Patient Stated Goal: Regain IND and not have to come back for a TKR on the L PT Goal Formulation: With patient Time For Goal Achievement: 09/12/15 Potential to Achieve Goals: Good Progress towards PT goals: Progressing toward goals    Frequency  7X/week    PT Plan Current plan remains appropriate    Co-evaluation             End of Session Equipment Utilized During Treatment: Gait belt Activity Tolerance: Patient tolerated treatment well Patient left: in chair;with call bell/phone within reach;with family/visitor present     Time: 0940-1003 PT Time Calculation (min) (ACUTE ONLY): 23 min  Charges:  $Gait Training: 8-22 mins $Therapeutic Exercise: 8-22 mins  G Codes:      Jamie Galloway 10-04-2015, 12:22 PM

## 2015-09-10 NOTE — Progress Notes (Signed)
OT Cancellation Note  Patient Details Name: Jamie Galloway MRN: [id number] DOB: Jan 14, 1941   Cancelled Treatment:    Reason Eval/Treat Not Completed: OT screened, no needs identified, will sign off -- Patient familiar with all OT/ADL techniques from prior hip surgery in March 2017. Has all necessary DME. Patient denies need for OT at this time. Will sign off.  Zein Helbing A 09/10/2015, 8:54 AM

## 2015-09-10 NOTE — Progress Notes (Signed)
   Subjective: 1 Day Post-Op Procedure(s) (LRB): LEFT TOTAL HIP ARTHROPLASTY ANTERIOR APPROACH (Left) Patient reports pain as mild.   Plan is to go Home after hospital stay.  Objective: Vital signs in last 24 hours: Temp:  [97.6 F (36.4 C)-98.8 F (37.1 C)] 98.4 F (36.9 C) (08/24 0558) Pulse Rate:  [64-101] 69 (08/24 0558) Resp:  [13-19] 16 (08/24 0558) BP: (86-135)/(54-77) 112/64 (08/24 0558) SpO2:  [94 %-100 %] 95 % (08/24 0558)  Intake/Output from previous day:  Intake/Output Summary (Last 24 hours) at 09/10/15 0646 Last data filed at 09/10/15 0600  Gross per 24 hour  Intake          3708.75 ml  Output             3025 ml  Net           683.75 ml    Intake/Output this shift: Total I/O In: 1145 [P.O.:220; I.V.:825; IV Piggyback:100] Out: 1130 [Urine:1100; Drains:30]  Labs:  Recent Labs  09/10/15 0429  HGB 12.2    Recent Labs  09/10/15 0429  WBC 12.7*  RBC 4.01  HCT 34.3*  PLT 223    Recent Labs  09/10/15 0429  NA 139  K 4.9  CL 109  CO2 27  BUN 12  CREATININE 0.62  GLUCOSE 120*  CALCIUM 8.5*   No results for input(s): LABPT, INR in the last 72 hours.  EXAM General - Patient is Alert, Appropriate and Oriented Extremity - Neurologically intact Neurovascular intact No cellulitis present Compartment soft Dressing - dressing C/D/I Motor Function - intact, moving foot and toes well on exam.  Hemovac pulled without difficulty.  Past Medical History:  Diagnosis Date  . Arthritis    PAIN AND OA RIGHT KNEE  . Hemorrhoids    JUST OCCAS BLOOD - NO PROBLEMS AT PRESENT  . History of frequent urinary tract infections   . History of shingles    ABOUT 2012 - NO RESIDUAL PROBLEMS  . History of skin cancer     Assessment/Plan: 1 Day Post-Op Procedure(s) (LRB): LEFT TOTAL HIP ARTHROPLASTY ANTERIOR APPROACH (Left) Active Problems:   OA (osteoarthritis) of hip   Advance diet Up with therapy D/C IV fluids Discharge home with home  health  DVT Prophylaxis - Xarelto Weight Bearing As Tolerated left Leg Hemovac Pulled   Fredrik Mogel V

## 2015-09-10 NOTE — Discharge Summary (Signed)
Physician Discharge Summary   Patient ID: Jamie Galloway MRN: [id number] DOB/AGE: March 10, 1940 75 y.o.  Admit date: 09/09/2015 Discharge date: 09/10/2015  Primary Diagnosis:  Osteoarthritis of the Left hip.    Admission Diagnoses:  Past Medical History:  Diagnosis Date  . Arthritis    PAIN AND OA RIGHT KNEE  . Hemorrhoids    JUST OCCAS BLOOD - NO PROBLEMS AT PRESENT  . History of frequent urinary tract infections   . History of shingles    ABOUT 2012 - NO RESIDUAL PROBLEMS  . History of skin cancer    Discharge Diagnoses:   Active Problems:   OA (osteoarthritis) of hip  Estimated body mass index is 26.36 kg/m as calculated from the following:   Height as of this encounter: 5' 2.5" (1.588 m).   Weight as of this encounter: 66.4 kg (146 lb 7 oz).  Procedure(s) (LRB): LEFT TOTAL HIP ARTHROPLASTY ANTERIOR APPROACH (Left)   Consults: None  HPI: Jamie Galloway is a 75 y.o. female who has advanced end-  stage arthritis of their Left  hip with progressively worsening pain and  dysfunction.The patient has failed nonoperative management and presents for  total hip arthroplasty.   Laboratory Data: Admission on 09/09/2015  Component Date Value Ref Range Status  . WBC 09/10/2015 12.7* 4.0 - 10.5 K/uL Final  . RBC 09/10/2015 4.01  3.87 - 5.11 MIL/uL Final  . Hemoglobin 09/10/2015 12.2  12.0 - 15.0 g/dL Final  . HCT 09/10/2015 34.3* 36.0 - 46.0 % Final  . MCV 09/10/2015 85.5  78.0 - 100.0 fL Final  . MCH 09/10/2015 30.4  26.0 - 34.0 pg Final  . MCHC 09/10/2015 35.6  30.0 - 36.0 g/dL Final  . RDW 09/10/2015 14.0  11.5 - 15.5 % Final  . Platelets 09/10/2015 223  150 - 400 K/uL Final  . Sodium 09/10/2015 139  135 - 145 mmol/L Final  . Potassium 09/10/2015 4.9  3.5 - 5.1 mmol/L Final  . Chloride 09/10/2015 109  101 - 111 mmol/L Final  . CO2 09/10/2015 27  22 - 32 mmol/L Final  . Glucose, Bld 09/10/2015 120* 65 - 99 mg/dL Final  . BUN 09/10/2015 12  6 - 20 mg/dL Final  . Creatinine,  Ser 09/10/2015 0.62  0.44 - 1.00 mg/dL Final  . Calcium 09/10/2015 8.5* 8.9 - 10.3 mg/dL Final  . GFR calc non Af Amer 09/10/2015 >60  >60 mL/min Final  . GFR calc Af Amer 09/10/2015 >60  >60 mL/min Final   Comment: (NOTE) The eGFR has been calculated using the CKD EPI equation. This calculation has not been validated in all clinical situations. eGFR's persistently <60 mL/min signify possible Chronic Kidney Disease.   Georgiann Hahn gap 09/10/2015 3* 5 - 15 Final  Hospital Outpatient Visit on 09/03/2015  Component Date Value Ref Range Status  . aPTT 09/03/2015 28  24 - 36 seconds Final  . Prothrombin Time 09/03/2015 13.2  11.4 - 15.2 seconds Final  . INR 09/03/2015 1.00   Final  . ABO/RH(D) 09/09/2015 A POS   Final  . Antibody Screen 09/09/2015 NEG   Final  . Sample Expiration 09/09/2015 09/12/2015   Final  . Extend sample reason 09/09/2015 NO TRANSFUSIONS OR PREGNANCY IN THE PAST 3 MONTHS   Final  . Color, Urine 09/03/2015 YELLOW  YELLOW Final  . APPearance 09/03/2015 CLEAR  CLEAR Final  . Specific Gravity, Urine 09/03/2015 1.022  1.005 - 1.030 Final  . pH 09/03/2015 5.5  5.0 -  8.0 Final  . Glucose, UA 09/03/2015 NEGATIVE  NEGATIVE mg/dL Final  . Hgb urine dipstick 09/03/2015 NEGATIVE  NEGATIVE Final  . Bilirubin Urine 09/03/2015 NEGATIVE  NEGATIVE Final  . Ketones, ur 09/03/2015 NEGATIVE  NEGATIVE mg/dL Final  . Protein, ur 09/03/2015 NEGATIVE  NEGATIVE mg/dL Final  . Nitrite 09/03/2015 NEGATIVE  NEGATIVE Final  . Leukocytes, UA 09/03/2015 NEGATIVE  NEGATIVE Final  . MRSA, PCR 09/03/2015 NEGATIVE  NEGATIVE Final  . Staphylococcus aureus 09/03/2015 NEGATIVE  NEGATIVE Final   Comment:        The Xpert SA Assay (FDA approved for NASAL specimens in patients over 22 years of age), is one component of a comprehensive surveillance program.  Test performance has been validated by [facility] for patients greater than or equal to 10 year old. It is not intended to diagnose infection nor  to guide or monitor treatment.      X-Rays:Dg Pelvis Portable  Result Date: 09/09/2015 CLINICAL DATA:  Status post left hip arthroplasty EXAM: PORTABLE PELVIS 1-2 VIEWS COMPARISON:  03/25/2015 FINDINGS: Left hip replacement is now seen. No acute bony or soft tissue abnormality is noted. Surgical drain is noted. IMPRESSION: Status post left hip replacement. Electronically Signed   By: Inez Catalina M.D.   On: 09/09/2015 12:05    EKG:No orders found for this or any previous visit.   Hospital Course: Patient was admitted to [facility] and taken to the OR and underwent the above state procedure without complications.  Patient tolerated the procedure well and was later transferred to the recovery room and then to the orthopaedic floor for postoperative care.  They were given PO and IV analgesics for pain control following their surgery.  They were given 24 hours of postoperative antibiotics of  Anti-infectives    Start     Dose/Rate Route Frequency Ordered Stop   09/09/15 1600  ceFAZolin (ANCEF) IVPB 2g/100 mL premix     2 g 200 mL/hr over 30 Minutes Intravenous Every 6 hours 09/09/15 1443 09/09/15 2254   09/09/15 0619  ceFAZolin (ANCEF) IVPB 2g/100 mL premix     2 g 200 mL/hr over 30 Minutes Intravenous On call to O.R. 09/09/15 3491 09/09/15 1011     and started on DVT prophylaxis in the form of Xarelto.   PT and OT were ordered for total hip protocol.  The patient was allowed to be WBAT with therapy. Discharge planning was consulted to help with postop disposition and equipment needs.  Patient had a good night on the evening of surgery.  They started to get up OOB with therapy on day one.  Hemovac drain was pulled without difficulty.  Dressing was checked and was clean ad dry. Patient was seen in rounds and it was felt that as long as they did well with therapy that they would be ready to go home.  Diet: Regular diet Activity:WBAT Follow-up:in 2 weeks Disposition - Home Discharged  Condition: improving   Discharge Instructions    Call MD / Call 911    Complete by:  As directed   If you experience chest pain or shortness of breath, CALL 911 and be transported to the hospital emergency room.  If you develope a fever above 101 F, pus (white drainage) or increased drainage or redness at the wound, or calf pain, call your surgeon's office.   Constipation Prevention    Complete by:  As directed   Drink plenty of fluids.  Prune juice may be helpful.  You may use a stool softener, such as Colace (over the counter) 100 mg twice a day.  Use MiraLax (over the counter) for constipation as needed.   Diet - low sodium heart healthy    Complete by:  As directed   Discharge instructions    Complete by:  As directed   INSTRUCTIONS AFTER JOINT REPLACEMENT   Remove items at home which could result in a fall. This includes throw rugs or furniture in walking pathways ICE to the affected joint every three hours while awake for 30 minutes at a time, for at least the first 3-5 days, and then as needed for pain and swelling.  Continue to use ice for pain and swelling. You may notice swelling that will progress down to the foot and ankle.  This is normal after surgery.  Elevate your leg when you are not up walking on it.   Continue to use the breathing machine you got in the hospital (incentive spirometer) which will help keep your temperature down.  It is common for your temperature to cycle up and down following surgery, especially at night when you are not up moving around and exerting yourself.  The breathing machine keeps your lungs expanded and your temperature down.   DIET:  As you were doing prior to hospitalization, we recommend a well-balanced diet.  DRESSING / WOUND CARE / SHOWERING  You may change your dressing every day with sterile gauze.  Please use good hand washing techniques before changing the dressing.  Do not use any lotions or creams on the incision until instructed by your  surgeon.  ACTIVITY  Increase activity slowly as tolerated, but follow the weight bearing instructions below.   No driving for 6 weeks or until further direction given by your physician.  You cannot drive while taking narcotics.  No lifting or carrying greater than 10 lbs. until further directed by your surgeon. Avoid periods of inactivity such as sitting longer than an hour when not asleep. This helps prevent blood clots.  You may return to work once you are authorized by your doctor.     WEIGHT BEARING   Weight bearing as tolerated with assist device (walker, cane, etc) as directed, use it as long as suggested by your surgeon or therapist, typically at least 4-6 weeks.   EXERCISES  Results after joint replacement surgery are often greatly improved when you follow the exercise, range of motion and muscle strengthening exercises prescribed by your doctor. Safety measures are also important to protect the joint from further injury. Any time any of these exercises cause you to have increased pain or swelling, decrease what you are doing until you are comfortable again and then slowly increase them. If you have problems or questions, call your caregiver or physical therapist for advice.   Rehabilitation is important following a joint replacement. After just a few days of immobilization, the muscles of the leg can become weakened and shrink (atrophy).  These exercises are designed to build up the tone and strength of the thigh and leg muscles and to improve motion. Often times heat used for twenty to thirty minutes before working out will loosen up your tissues and help with improving the range of motion but do not use heat for the first two weeks following surgery (sometimes heat can increase post-operative swelling).   These exercises can be done on a training (exercise) mat, on the floor, on a table or on a bed. Use whatever works the best and  is most comfortable for you.    Use music or  television while you are exercising so that the exercises are a pleasant break in your day. This will make your life better with the exercises acting as a break in your routine that you can look forward to.   Perform all exercises about fifteen times, three times per day or as directed.  You should exercise both the operative leg and the other leg as well.  Exercises include:   Quad Sets - Tighten up the muscle on the front of the thigh (Quad) and hold for 5-10 seconds.   Straight Leg Raises - With your knee straight (if you were given a brace, keep it on), lift the leg to 60 degrees, hold for 3 seconds, and slowly lower the leg.  Perform this exercise against resistance later as your leg gets stronger.  Leg Slides: Lying on your back, slowly slide your foot toward your buttocks, bending your knee up off the floor (only go as far as is comfortable). Then slowly slide your foot back down until your leg is flat on the floor again.  Angel Wings: Lying on your back spread your legs to the side as far apart as you can without causing discomfort.  Hamstring Strength:  Lying on your back, push your heel against the floor with your leg straight by tightening up the muscles of your buttocks.  Repeat, but this time bend your knee to a comfortable angle, and push your heel against the floor.  You may put a pillow under the heel to make it more comfortable if necessary.   A rehabilitation program following joint replacement surgery can speed recovery and prevent re-injury in the future due to weakened muscles. Contact your doctor or a physical therapist for more information on knee rehabilitation.    CONSTIPATION  Constipation is defined medically as fewer than three stools per week and severe constipation as less than one stool per week.  Even if you have a regular bowel pattern at home, your normal regimen is likely to be disrupted due to multiple reasons following surgery.  Combination of anesthesia,  postoperative narcotics, change in appetite and fluid intake all can affect your bowels.   YOU MUST use at least one of the following options; they are listed in order of increasing strength to get the job done.  They are all available over the counter, and you may need to use some, POSSIBLY even all of these options:    Drink plenty of fluids (prune juice may be helpful) and high fiber foods Colace 100 mg by mouth twice a day  Senokot for constipation as directed and as needed Dulcolax (bisacodyl), take with full glass of water  Miralax (polyethylene glycol) once or twice a day as needed.  If you have tried all these things and are unable to have a bowel movement in the first 3-4 days after surgery call either your surgeon or your primary doctor.    If you experience loose stools or diarrhea, hold the medications until you stool forms back up.  If your symptoms do not get better within 1 week or if they get worse, check with your doctor.  If you experience "the worst abdominal pain ever" or develop nausea or vomiting, please contact the office immediately for further recommendations for treatment.   ITCHING:  If you experience itching with your medications, try taking only a single pain pill, or even half a pain pill at a time.  You can also use Benadryl over the counter for itching or also to help with sleep.   TED HOSE STOCKINGS:  Use stockings on both legs until for at least 2 weeks or as directed by physician office. They may be removed at night for sleeping.  MEDICATIONS:  See your medication summary on the "After Visit Summary" that nursing will review with you.  You may have some home medications which will be placed on hold until you complete the course of blood thinner medication.  It is important for you to complete the blood thinner medication as prescribed.  PRECAUTIONS:  If you experience chest pain or shortness of breath - call 911 immediately for transfer to the hospital emergency  department.   If you develop a fever greater that 101 F, purulent drainage from wound, increased redness or drainage from wound, foul odor from the wound/dressing, or calf pain - CONTACT YOUR SURGEON.                                                   FOLLOW-UP APPOINTMENTS:  If you do not already have a post-op appointment, please call the office for an appointment to be seen by your surgeon.  Guidelines for how soon to be seen are listed in your "After Visit Summary", but are typically between 1-4 weeks after surgery.   MAKE SURE YOU:  Understand these instructions.  Get help right away if you are not doing well or get worse.    Thank you for letting us be a part of your medical care team.  It is a privilege we respect greatly.  We hope these instructions will help you stay on track for a fast and full recovery!   Increase activity slowly as tolerated    Complete by:  As directed       Medication List    STOP taking these medications   aspirin 81 MG chewable tablet   ibuprofen 200 MG tablet Commonly known as:  ADVIL,MOTRIN     TAKE these medications   methocarbamol 500 MG tablet Commonly known as:  ROBAXIN Take 1 tablet (500 mg total) by mouth every 6 (six) hours as needed for muscle spasms.   minoxidil 2 % external solution Commonly known as:  ROGAINE Apply 1 application topically at bedtime.   oxyCODONE 5 MG immediate release tablet Commonly known as:  Oxy IR/ROXICODONE Take 1-2 tablets (5-10 mg total) by mouth every 3 (three) hours as needed for moderate pain or severe pain.   rivaroxaban 10 MG Tabs tablet Commonly known as:  XARELTO Take 1 tablet (10 mg total) by mouth daily with breakfast. Take Xarelto for two and a half more weeks, then discontinue Xarelto. Once the patient has completed the Xarelto, they may resume the 81 mg Aspirin.   rosuvastatin 10 MG tablet Commonly known as:  CRESTOR Take 10 mg by mouth at bedtime.   traMADol 50 MG tablet Commonly known  as:  ULTRAM Take 1-2 tablets (50-100 mg total) by mouth every 6 (six) hours as needed for moderate pain.      Follow-up Information    Gearlean Alf, MD. Schedule an appointment as soon as possible for a visit on 09/22/2015.   Specialty:  Orthopedic Surgery Why:  Call office at [phone] to setup appointment on Tuesday 09/22/2015. Contact information: [place]  [address] [place] [address] [phone]           Signed: Arlee Muslim, PA-C Orthopaedic Surgery 09/10/2015, 7:06 AM

## 2015-09-22 DIAGNOSIS — Z471 Aftercare following joint replacement surgery: Secondary | ICD-10-CM | POA: Diagnosis not present

## 2015-09-22 DIAGNOSIS — Z96641 Presence of right artificial hip joint: Secondary | ICD-10-CM | POA: Diagnosis not present

## 2015-10-13 DIAGNOSIS — Z471 Aftercare following joint replacement surgery: Secondary | ICD-10-CM | POA: Diagnosis not present

## 2015-10-13 DIAGNOSIS — Z96641 Presence of right artificial hip joint: Secondary | ICD-10-CM | POA: Diagnosis not present

## 2015-10-24 IMAGING — CR DG CHEST 2V
2 series · 2 of 2 positions shown · non-contrast
Comparison: None.

CLINICAL DATA: Cough.  Preop right total knee arthroplasty.

EXAM:
CHEST  2 VIEW

[w chest pa]
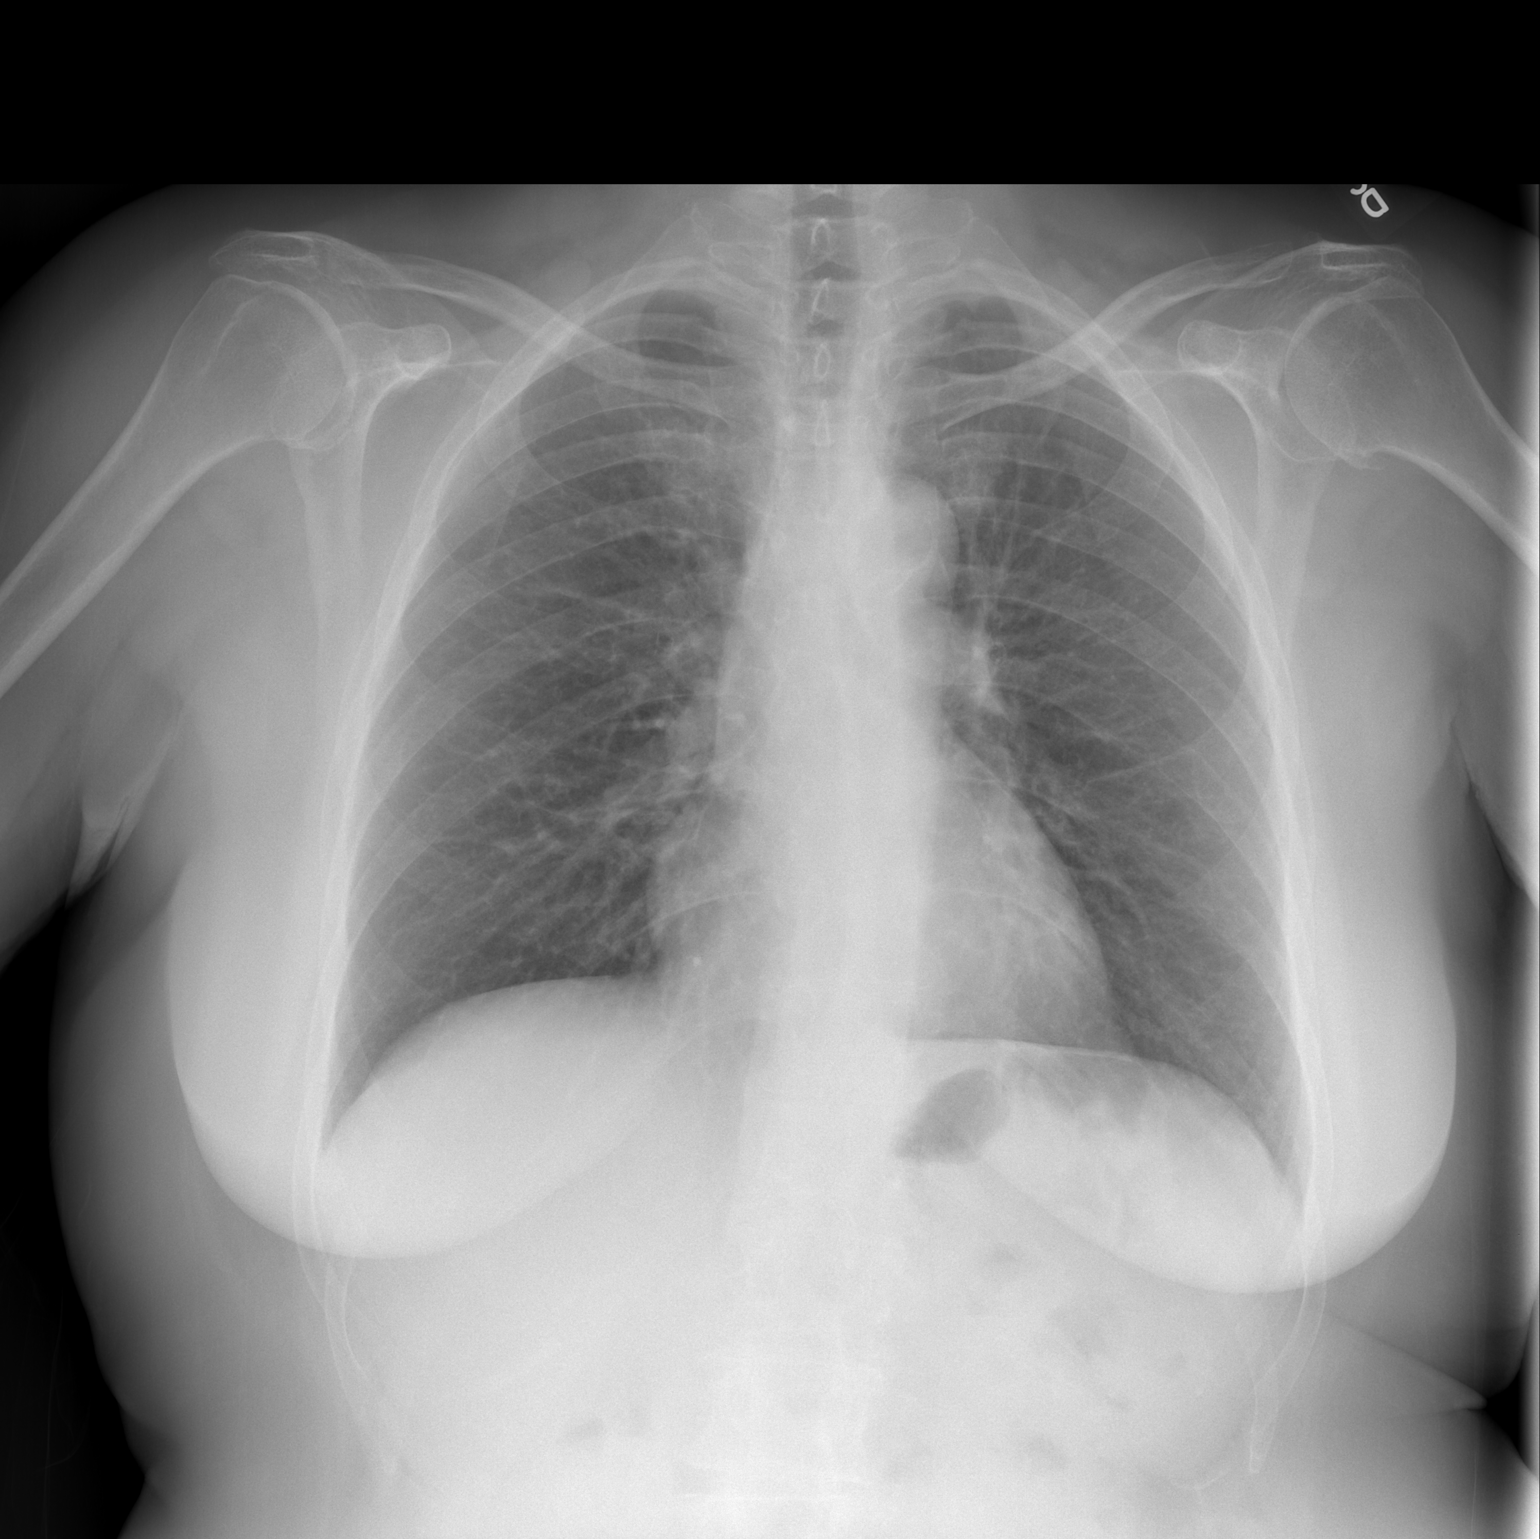

[w chest lat]
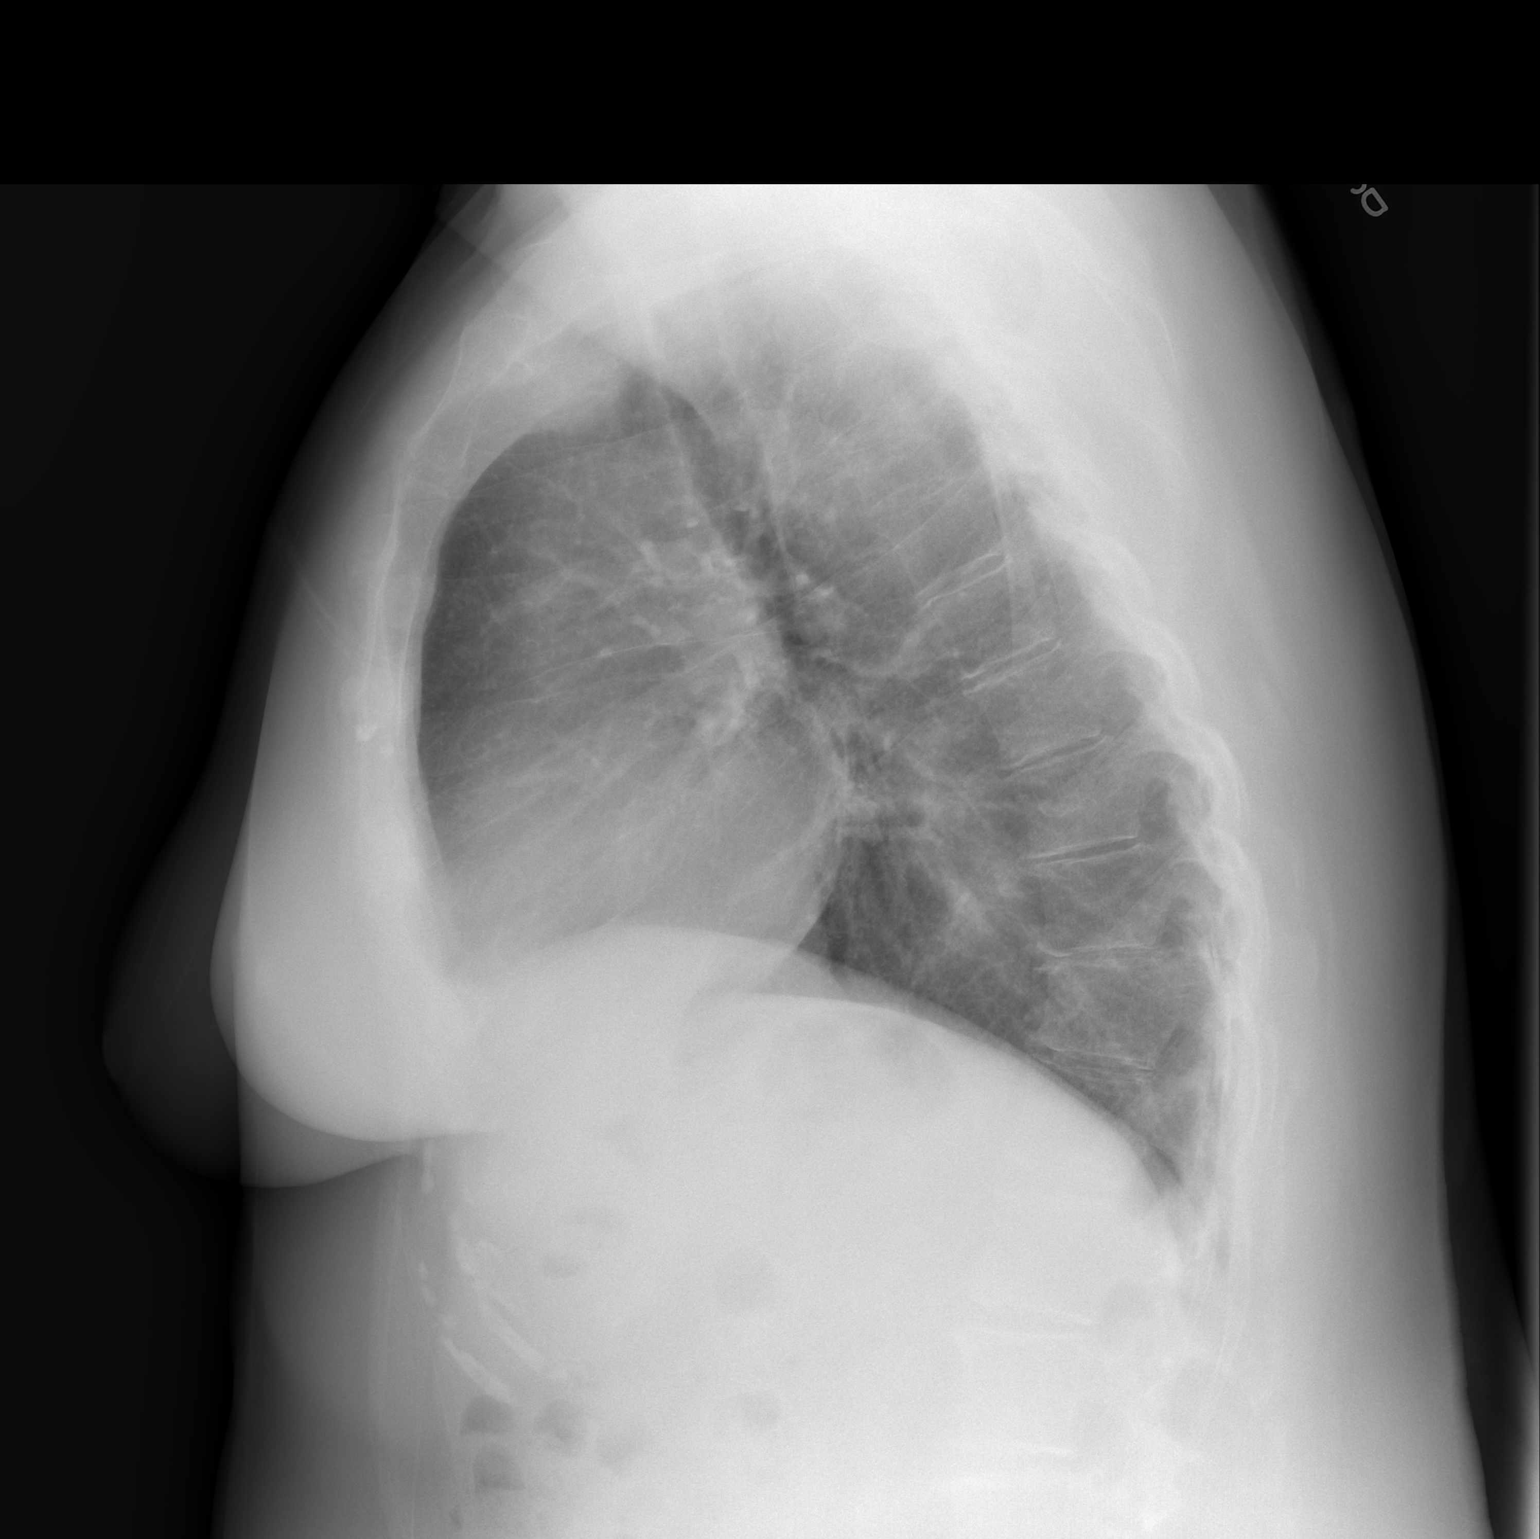

[2 of 2 positions shown; findings below may reference images not displayed]

FINDINGS: Normal sized heart. Clear lungs. Minimal diffuse peribronchial
thickening. Mild thoracic spine degenerative changes. Left shoulder
degenerative changes.
IMPRESSION: Minimal bronchitic changes.

## 2015-10-26 DIAGNOSIS — I6523 Occlusion and stenosis of bilateral carotid arteries: Secondary | ICD-10-CM | POA: Diagnosis not present

## 2015-10-29 DIAGNOSIS — Z01810 Encounter for preprocedural cardiovascular examination: Secondary | ICD-10-CM | POA: Diagnosis not present

## 2015-10-29 DIAGNOSIS — I6523 Occlusion and stenosis of bilateral carotid arteries: Secondary | ICD-10-CM | POA: Diagnosis not present

## 2015-11-20 DIAGNOSIS — Z471 Aftercare following joint replacement surgery: Secondary | ICD-10-CM | POA: Diagnosis not present

## 2015-11-20 DIAGNOSIS — Z96642 Presence of left artificial hip joint: Secondary | ICD-10-CM | POA: Diagnosis not present

## 2016-02-11 DIAGNOSIS — L565 Disseminated superficial actinic porokeratosis (DSAP): Secondary | ICD-10-CM | POA: Diagnosis not present

## 2016-02-11 DIAGNOSIS — D225 Melanocytic nevi of trunk: Secondary | ICD-10-CM | POA: Diagnosis not present

## 2016-02-11 DIAGNOSIS — Z85828 Personal history of other malignant neoplasm of skin: Secondary | ICD-10-CM | POA: Diagnosis not present

## 2016-02-11 DIAGNOSIS — D1801 Hemangioma of skin and subcutaneous tissue: Secondary | ICD-10-CM | POA: Diagnosis not present

## 2016-02-11 DIAGNOSIS — L570 Actinic keratosis: Secondary | ICD-10-CM | POA: Diagnosis not present

## 2016-02-11 DIAGNOSIS — L821 Other seborrheic keratosis: Secondary | ICD-10-CM | POA: Diagnosis not present

## 2016-04-25 ENCOUNTER — Other Ambulatory Visit: Payer: Self-pay | Admitting: Family Medicine

## 2016-04-25 DIAGNOSIS — Z1231 Encounter for screening mammogram for malignant neoplasm of breast: Secondary | ICD-10-CM

## 2016-05-18 DIAGNOSIS — Z0000 Encounter for general adult medical examination without abnormal findings: Secondary | ICD-10-CM | POA: Diagnosis not present

## 2016-05-18 DIAGNOSIS — E7800 Pure hypercholesterolemia, unspecified: Secondary | ICD-10-CM | POA: Diagnosis not present

## 2016-05-18 DIAGNOSIS — M8588 Other specified disorders of bone density and structure, other site: Secondary | ICD-10-CM | POA: Diagnosis not present

## 2016-05-23 DIAGNOSIS — H5213 Myopia, bilateral: Secondary | ICD-10-CM | POA: Diagnosis not present

## 2016-05-23 DIAGNOSIS — Z961 Presence of intraocular lens: Secondary | ICD-10-CM | POA: Diagnosis not present

## 2016-05-23 DIAGNOSIS — H04123 Dry eye syndrome of bilateral lacrimal glands: Secondary | ICD-10-CM | POA: Diagnosis not present

## 2016-05-23 DIAGNOSIS — H43813 Vitreous degeneration, bilateral: Secondary | ICD-10-CM | POA: Diagnosis not present

## 2016-06-06 ENCOUNTER — Ambulatory Visit
Admission: RE | Admit: 2016-06-06 | Discharge: 2016-06-06 | Disposition: A | Discharge status: Home / Self Care | Payer: Medicare Other | Source: Ambulatory Visit | Attending: Family Medicine | Admitting: Family Medicine

## 2016-06-06 DIAGNOSIS — Z1231 Encounter for screening mammogram for malignant neoplasm of breast: Secondary | ICD-10-CM | POA: Diagnosis not present

## 2016-06-08 ENCOUNTER — Other Ambulatory Visit: Payer: Self-pay | Admitting: Family Medicine

## 2016-06-08 DIAGNOSIS — M8580 Other specified disorders of bone density and structure, unspecified site: Secondary | ICD-10-CM

## 2016-06-14 ENCOUNTER — Ambulatory Visit
Admission: RE | Admit: 2016-06-14 | Discharge: 2016-06-14 | Disposition: A | Discharge status: Home / Self Care | Payer: Medicare Other | Source: Ambulatory Visit | Attending: Family Medicine | Admitting: Family Medicine

## 2016-06-14 DIAGNOSIS — M8580 Other specified disorders of bone density and structure, unspecified site: Secondary | ICD-10-CM

## 2016-06-14 DIAGNOSIS — Z13820 Encounter for screening for osteoporosis: Secondary | ICD-10-CM | POA: Diagnosis not present

## 2016-06-14 DIAGNOSIS — Z780 Asymptomatic menopausal state: Secondary | ICD-10-CM | POA: Diagnosis not present

## 2016-11-02 IMAGING — US US BIOPSY
1 series · 13 of 16 positions shown · non-contrast
Comparison: Soft tissue neck ultrasound - 05/27/2015;

INDICATION: No known primary, now with palpable nodule within the right
supraclavicular fossa, potentially a supraclavicular lymph node.
Please perform ultrasound-guided biopsy for tissue diagnostic
purposes.

EXAM:
ULTRASOUND GUIDED BIOPSY OF RIGHT SUPRACLAVICULAR LYMPH NODE
TECHNIQUE: Informed written consent was obtained from the patient after a
discussion of the risks, benefits and alternatives to treatment.
Questions regarding the procedure were encouraged and answered.
Initial ultrasound scanning demonstrated a grossly unchanged
slightly ill-defined approximately 1.9 x 1.0 cm nodule within the
lateral aspect of the right supraclavicular fossa (image 2).
Compatible with the findings seen on preceding soft tissue neck
ultrasound and contrast-enhanced neck CT. Note, during sonographic
evaluation of this nodule, the nodule appear to be somewhat mobile.
An ultrasound image was saved for documentation purposes. The
procedure was planned. A timeout was performed prior to the
initiation of the procedure.

[Series 1: us biopsy · 0.05mm/px · 13 of 16 slices shown]
[im 1/16]
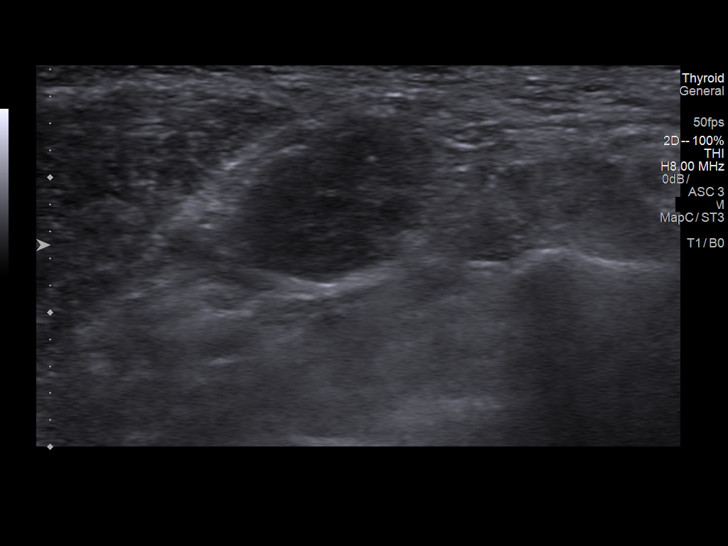
[im 2/16]
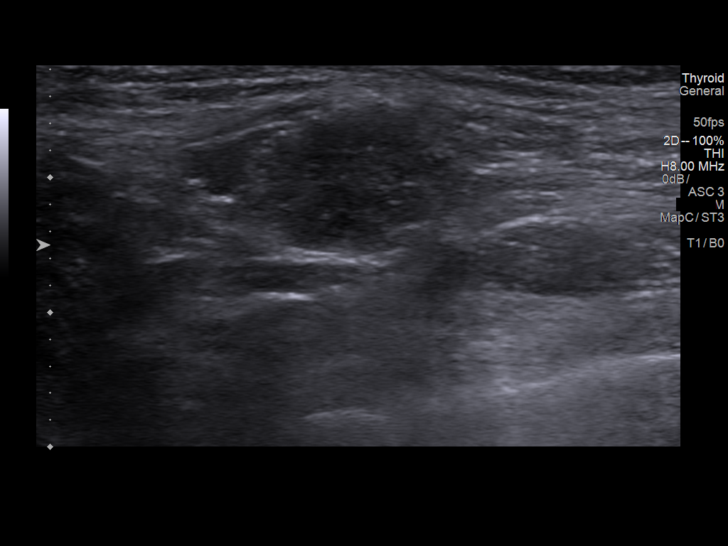
[im 4/16]
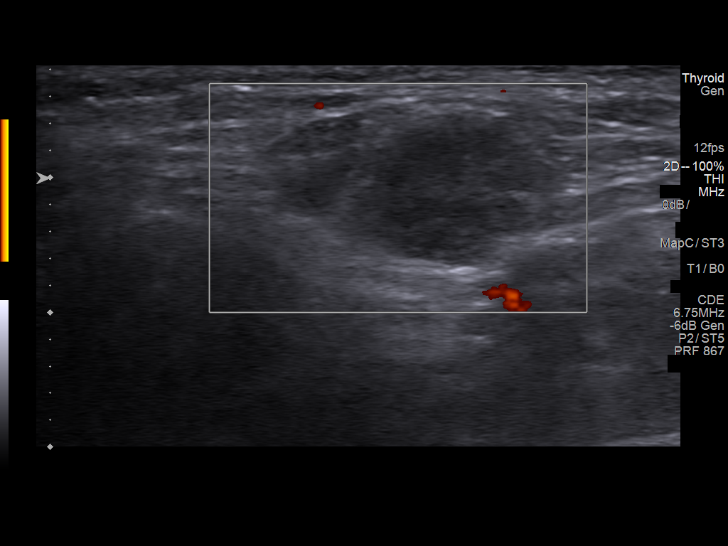
[im 5/16]
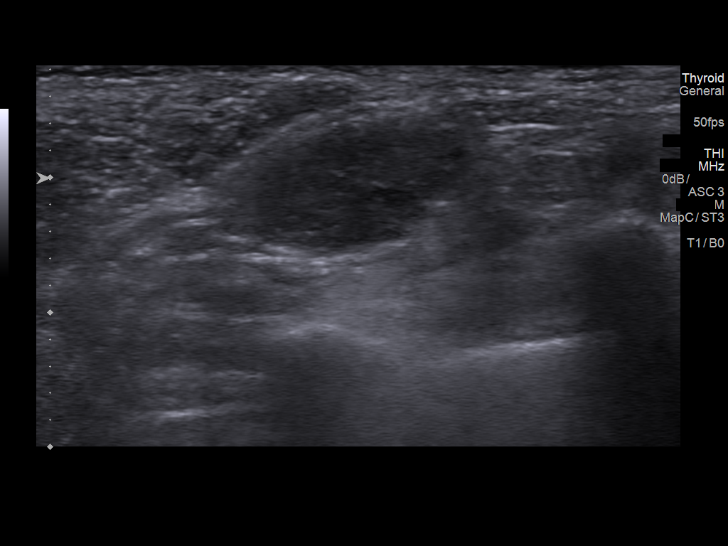
[im 6/16]
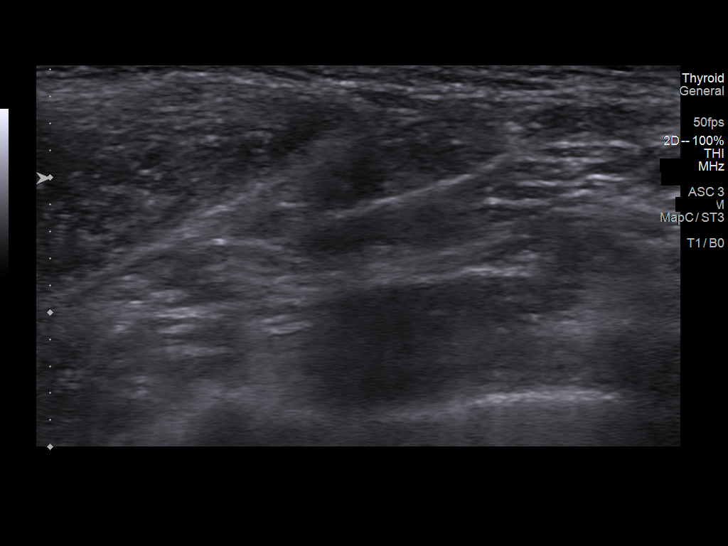
[im 7/16]
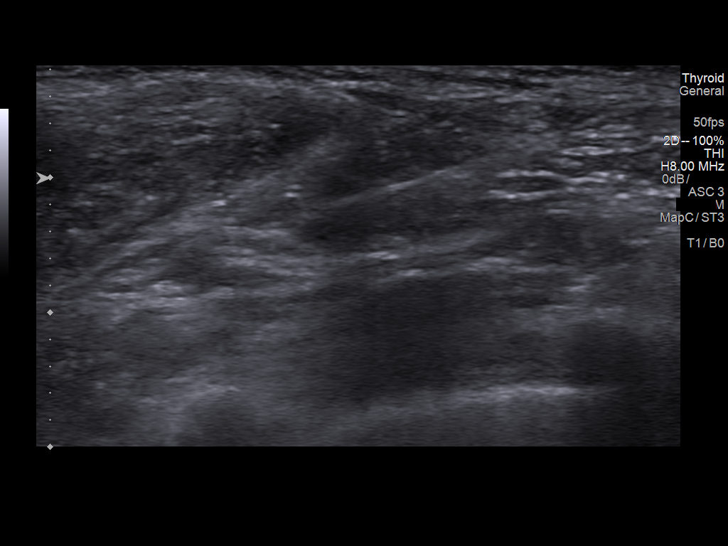
[im 9/16]
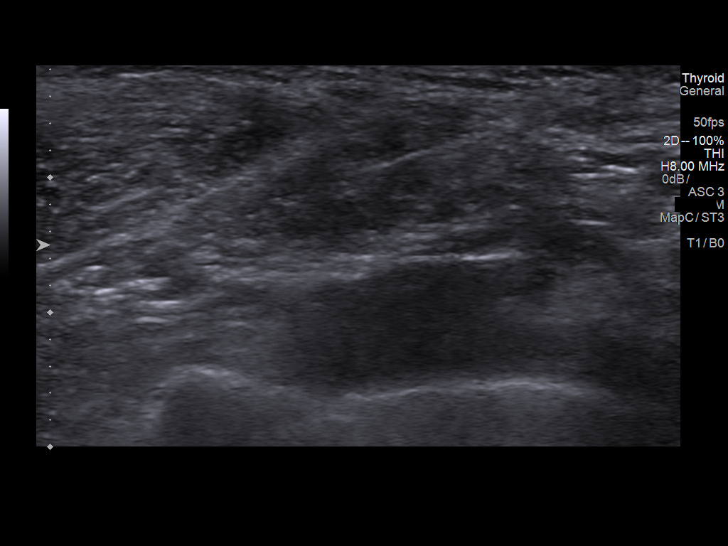
[im 10/16]
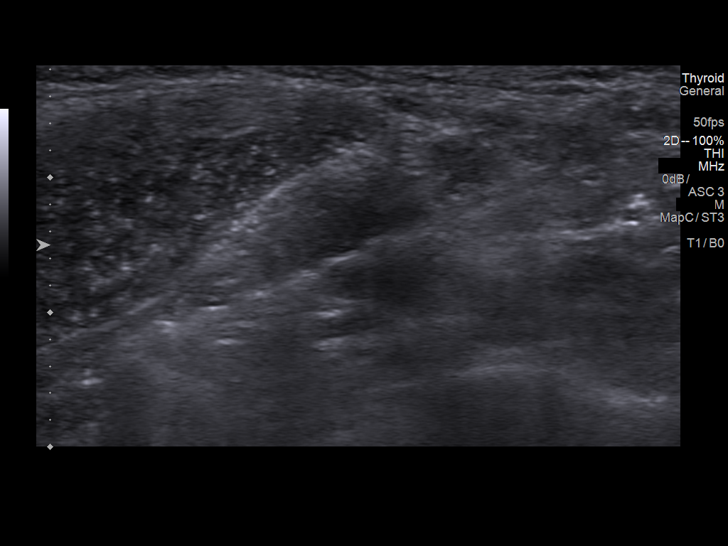
[im 11/16]
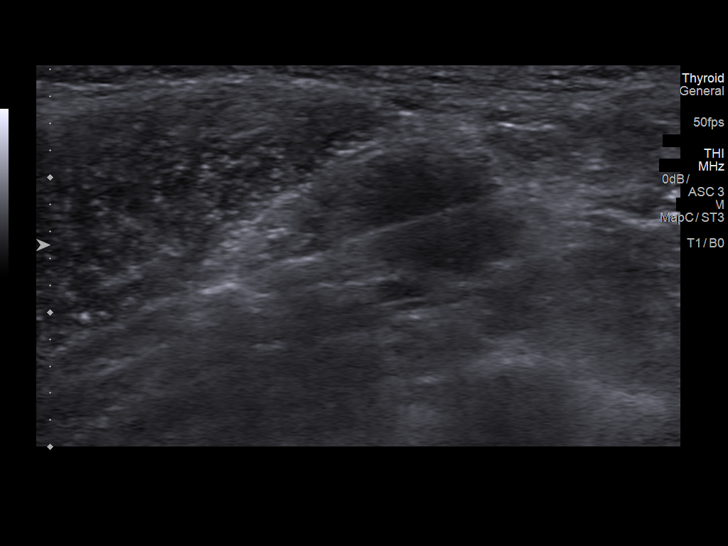
[im 12/16]
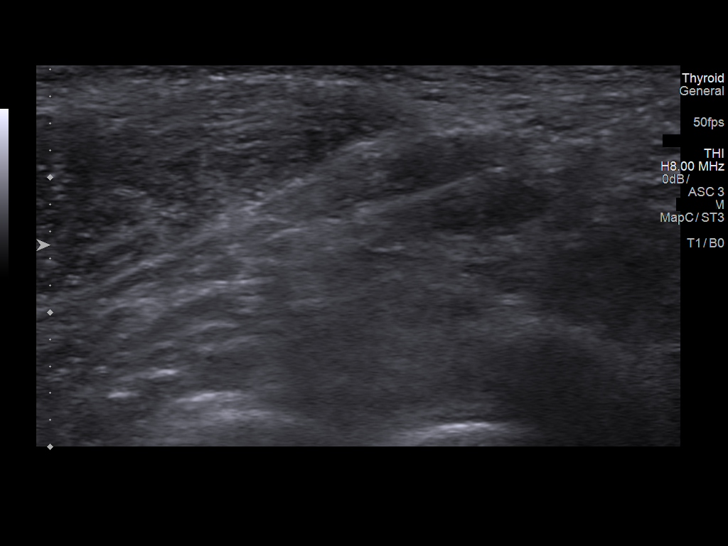
[im 13/16]
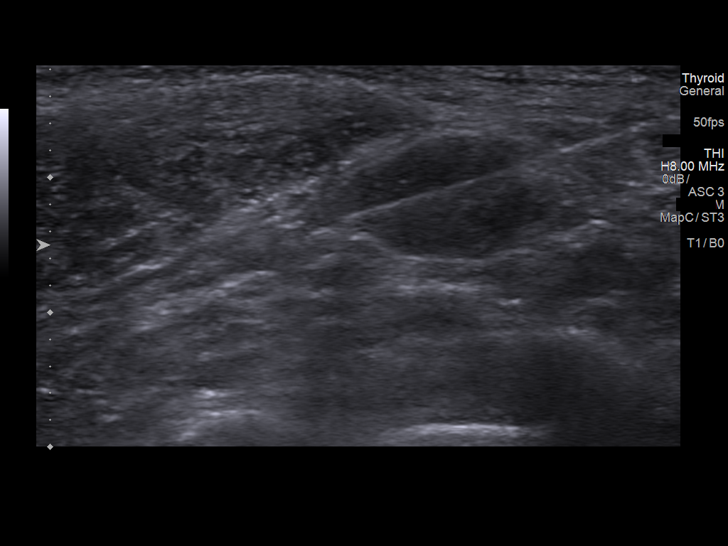
[im 15/16]
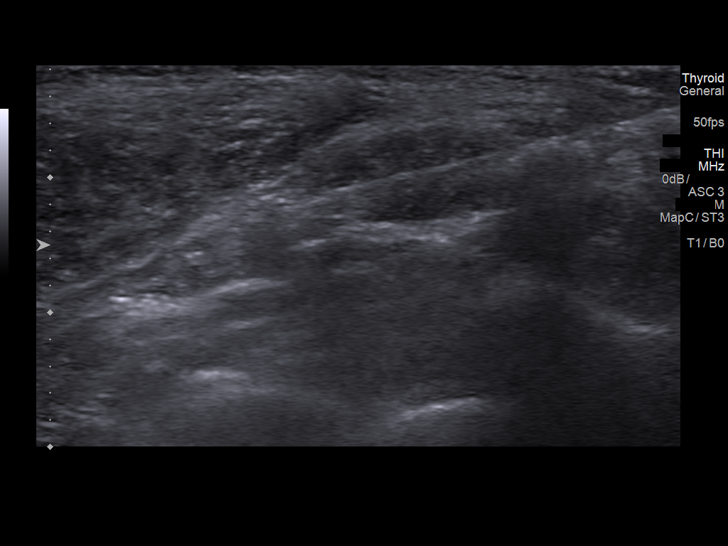
[im 16/16]
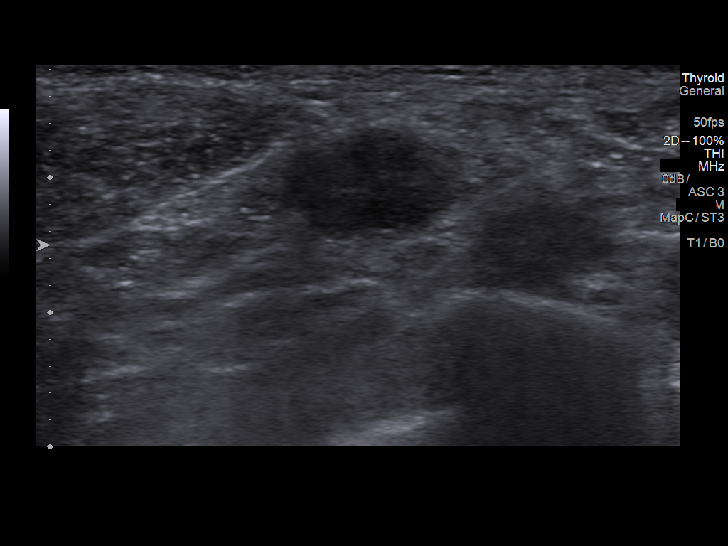

[13 of 16 positions shown; findings below may reference images not displayed]

contrast-enhanced neck CT - 06/08/2015

MEDICATIONS:
None

ANESTHESIA/SEDATION:
Moderate (conscious) sedation was employed during this procedure. A
total of Versed 1 mg and Fentanyl 50 mcg was administered
intravenously.

Moderate Sedation Time: 15 minutes. The patient's level of
consciousness and vital signs were monitored continuously by
radiology nursing throughout the procedure under my direct
supervision.

COMPLICATIONS:
None immediate.
The operative was prepped and draped in the usual sterile fashion,
and a sterile drape was applied covering the operative field. A
timeout was performed prior to the initiation of the procedure.
Local anesthesia was provided with 1% lidocaine with epinephrine.

Under direct ultrasound guidance, an 18 gauge core needle device was
utilized to obtain to obtain 5 core needle biopsied of the
indeterminate nodule within the supraclavicular fossa.

The samples were placed in saline and submitted to pathology. The
needle was removed and hemostasis was achieved with manual
compression. Post procedure scan was negative for significant
hematoma. A dressing was placed. The patient tolerated the procedure
well without immediate postprocedural complication.
IMPRESSION: Technically successful ultrasound guided indeterminate approximately
1.9 cm nodule within the right supraclavicular fossa. Note, during
sonographic evaluation and biopsy, this nodule was noted to be
somewhat mobile, nonspecific though could potentially be indicative
of a lipoma with this nodule representing an atypical lipoma.
Correlation with the biopsy results is recommended.

## 2016-11-22 DIAGNOSIS — Z23 Encounter for immunization: Secondary | ICD-10-CM | POA: Diagnosis not present

## 2016-11-22 DIAGNOSIS — E7800 Pure hypercholesterolemia, unspecified: Secondary | ICD-10-CM | POA: Diagnosis not present

## 2016-12-02 DIAGNOSIS — Z96651 Presence of right artificial knee joint: Secondary | ICD-10-CM | POA: Diagnosis not present

## 2016-12-02 DIAGNOSIS — M1711 Unilateral primary osteoarthritis, right knee: Secondary | ICD-10-CM | POA: Diagnosis not present

## 2016-12-02 DIAGNOSIS — M1612 Unilateral primary osteoarthritis, left hip: Secondary | ICD-10-CM | POA: Diagnosis not present

## 2016-12-02 DIAGNOSIS — M1611 Unilateral primary osteoarthritis, right hip: Secondary | ICD-10-CM | POA: Diagnosis not present

## 2016-12-02 DIAGNOSIS — Z471 Aftercare following joint replacement surgery: Secondary | ICD-10-CM | POA: Diagnosis not present

## 2017-02-13 DIAGNOSIS — D0471 Carcinoma in situ of skin of right lower limb, including hip: Secondary | ICD-10-CM | POA: Diagnosis not present

## 2017-02-13 DIAGNOSIS — D485 Neoplasm of uncertain behavior of skin: Secondary | ICD-10-CM | POA: Diagnosis not present

## 2017-02-13 DIAGNOSIS — L821 Other seborrheic keratosis: Secondary | ICD-10-CM | POA: Diagnosis not present

## 2017-02-13 DIAGNOSIS — L905 Scar conditions and fibrosis of skin: Secondary | ICD-10-CM | POA: Diagnosis not present

## 2017-02-13 DIAGNOSIS — D1801 Hemangioma of skin and subcutaneous tissue: Secondary | ICD-10-CM | POA: Diagnosis not present

## 2017-02-13 DIAGNOSIS — M71341 Other bursal cyst, right hand: Secondary | ICD-10-CM | POA: Diagnosis not present

## 2017-02-13 DIAGNOSIS — Z85828 Personal history of other malignant neoplasm of skin: Secondary | ICD-10-CM | POA: Diagnosis not present

## 2017-02-13 DIAGNOSIS — D692 Other nonthrombocytopenic purpura: Secondary | ICD-10-CM | POA: Diagnosis not present

## 2017-02-13 DIAGNOSIS — D225 Melanocytic nevi of trunk: Secondary | ICD-10-CM | POA: Diagnosis not present

## 2017-02-13 DIAGNOSIS — M71342 Other bursal cyst, left hand: Secondary | ICD-10-CM | POA: Diagnosis not present

## 2017-02-13 DIAGNOSIS — L570 Actinic keratosis: Secondary | ICD-10-CM | POA: Diagnosis not present

## 2017-02-28 DIAGNOSIS — L570 Actinic keratosis: Secondary | ICD-10-CM | POA: Diagnosis not present

## 2017-02-28 DIAGNOSIS — D0471 Carcinoma in situ of skin of right lower limb, including hip: Secondary | ICD-10-CM | POA: Diagnosis not present

## 2017-02-28 DIAGNOSIS — Z85828 Personal history of other malignant neoplasm of skin: Secondary | ICD-10-CM | POA: Diagnosis not present

## 2017-03-10 IMAGING — DX DG PORTABLE PELVIS
2 series · 2 of 2 positions shown · non-contrast
Comparison: None.

CLINICAL DATA: Postop right total hip arthroplasty.

EXAM:
PORTABLE PELVIS 1-2 VIEWS

[pelvis ap (1 of 2)]
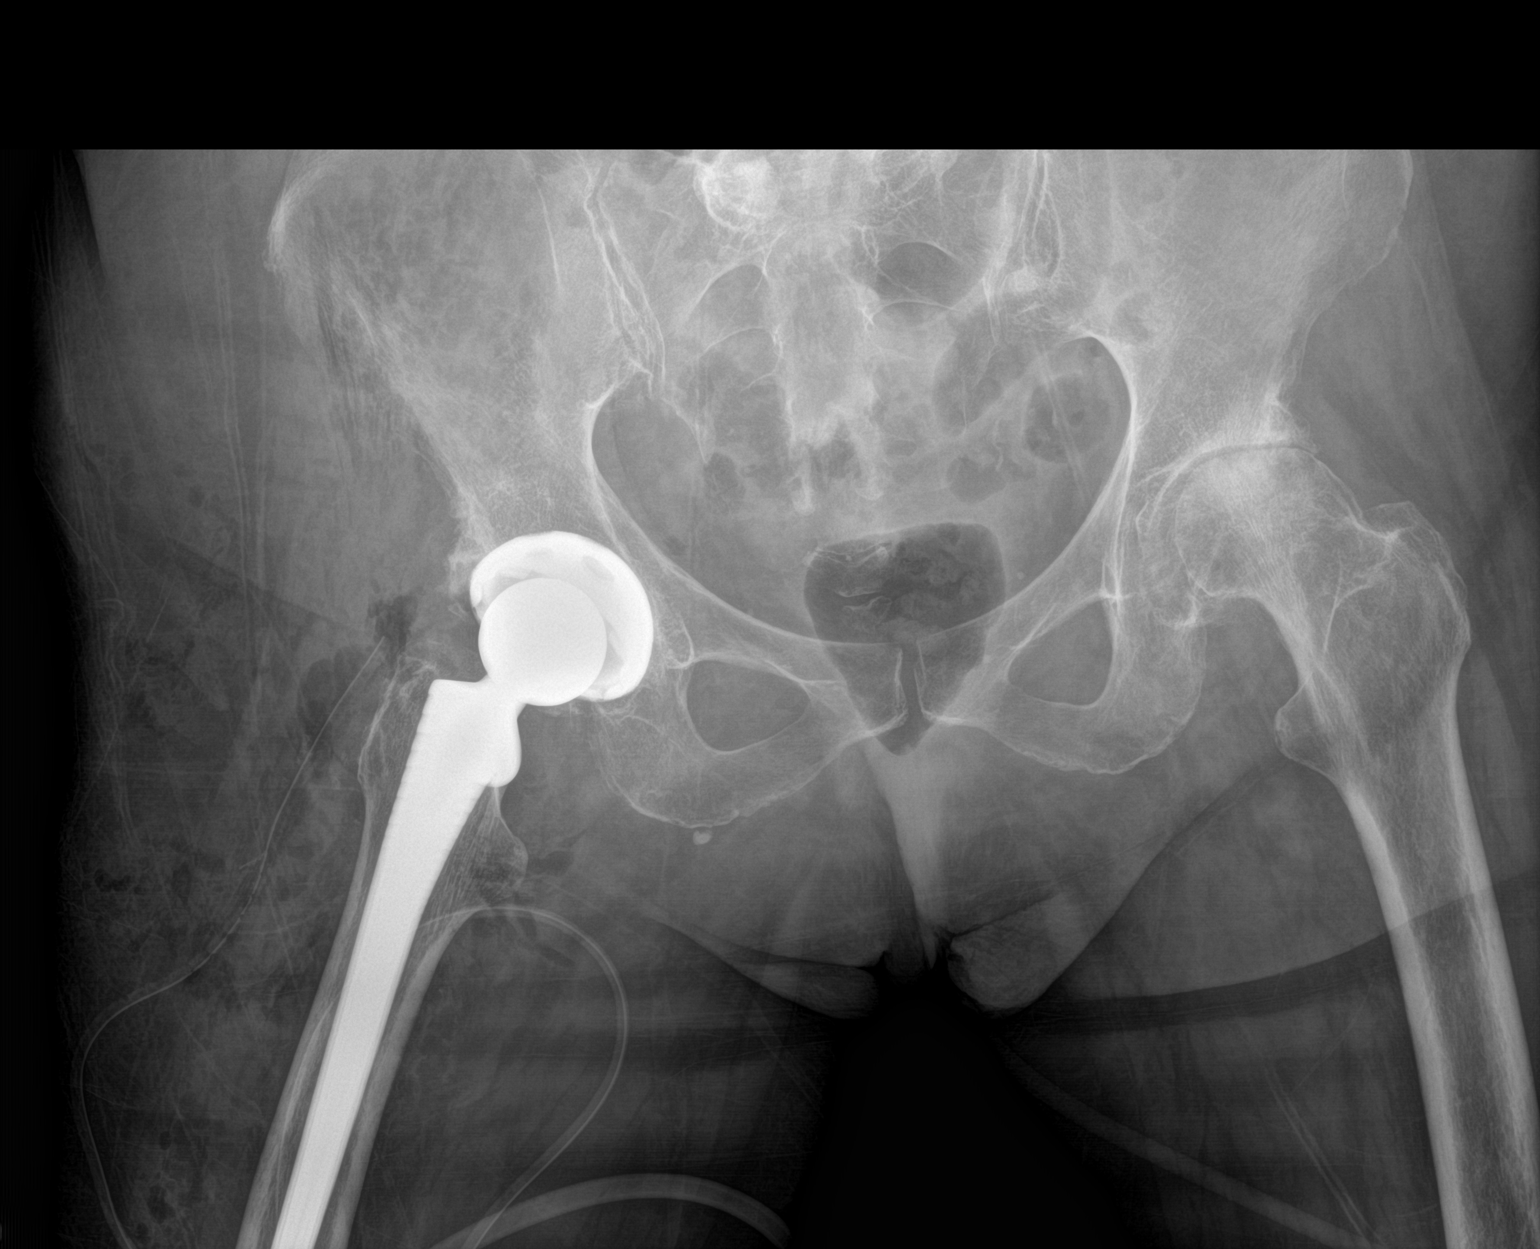

[pelvis ap (2 of 2)]
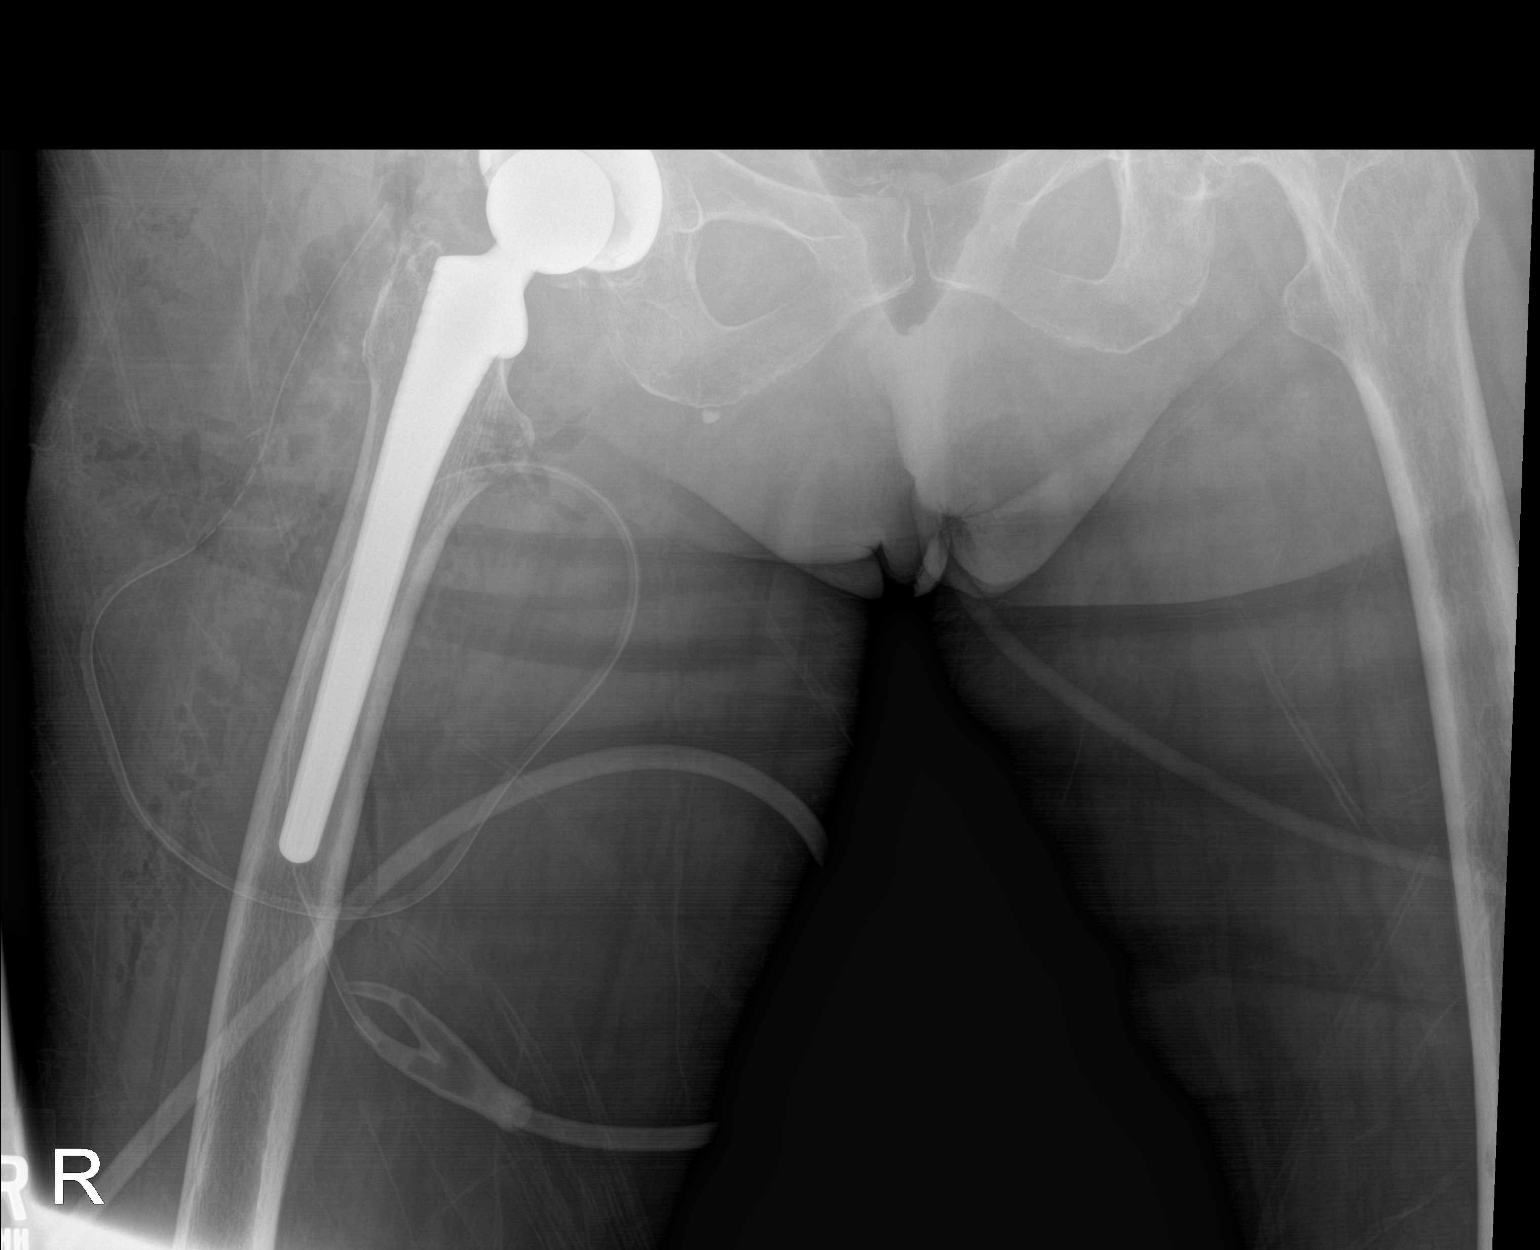

[2 of 2 positions shown; findings below may reference images not displayed]

FINDINGS: Two AP views of the pelvis demonstrate right total hip arthroplasty
without demonstrated complication. There is no evidence of acute
fracture or dislocation. A surgical drain is in place. There is some
gas within the soft tissues surrounding the proximal right femur.
The bones are demineralized. There are advanced degenerative changes
at the left hip with mild flattening of the left femoral head.
IMPRESSION: No demonstrated complication following right total hip arthroplasty.

## 2017-05-22 ENCOUNTER — Other Ambulatory Visit: Payer: Self-pay | Admitting: Family Medicine

## 2017-05-22 DIAGNOSIS — Z961 Presence of intraocular lens: Secondary | ICD-10-CM | POA: Diagnosis not present

## 2017-05-22 DIAGNOSIS — Z1231 Encounter for screening mammogram for malignant neoplasm of breast: Secondary | ICD-10-CM

## 2017-05-22 DIAGNOSIS — H35433 Paving stone degeneration of retina, bilateral: Secondary | ICD-10-CM | POA: Diagnosis not present

## 2017-05-22 DIAGNOSIS — H43813 Vitreous degeneration, bilateral: Secondary | ICD-10-CM | POA: Diagnosis not present

## 2017-05-23 DIAGNOSIS — Z0000 Encounter for general adult medical examination without abnormal findings: Secondary | ICD-10-CM | POA: Diagnosis not present

## 2017-05-23 DIAGNOSIS — E7800 Pure hypercholesterolemia, unspecified: Secondary | ICD-10-CM | POA: Diagnosis not present

## 2017-06-26 ENCOUNTER — Ambulatory Visit
Admission: RE | Admit: 2017-06-26 | Discharge: 2017-06-26 | Disposition: A | Discharge status: Home / Self Care | Payer: Medicare Other | Source: Ambulatory Visit | Attending: Family Medicine | Admitting: Family Medicine

## 2017-06-26 DIAGNOSIS — Z1231 Encounter for screening mammogram for malignant neoplasm of breast: Secondary | ICD-10-CM

## 2017-11-28 DIAGNOSIS — L720 Epidermal cyst: Secondary | ICD-10-CM | POA: Diagnosis not present

## 2017-11-28 DIAGNOSIS — L821 Other seborrheic keratosis: Secondary | ICD-10-CM | POA: Diagnosis not present

## 2017-11-28 DIAGNOSIS — L570 Actinic keratosis: Secondary | ICD-10-CM | POA: Diagnosis not present

## 2017-11-28 DIAGNOSIS — Z85828 Personal history of other malignant neoplasm of skin: Secondary | ICD-10-CM | POA: Diagnosis not present

## 2017-11-28 DIAGNOSIS — E7800 Pure hypercholesterolemia, unspecified: Secondary | ICD-10-CM | POA: Diagnosis not present

## 2018-06-04 DIAGNOSIS — E7800 Pure hypercholesterolemia, unspecified: Secondary | ICD-10-CM | POA: Diagnosis not present

## 2018-06-04 DIAGNOSIS — Z0000 Encounter for general adult medical examination without abnormal findings: Secondary | ICD-10-CM | POA: Diagnosis not present

## 2018-09-28 DIAGNOSIS — Z23 Encounter for immunization: Secondary | ICD-10-CM | POA: Diagnosis not present

## 2018-11-12 DIAGNOSIS — H43813 Vitreous degeneration, bilateral: Secondary | ICD-10-CM | POA: Diagnosis not present

## 2018-11-12 DIAGNOSIS — H52202 Unspecified astigmatism, left eye: Secondary | ICD-10-CM | POA: Diagnosis not present

## 2018-11-12 DIAGNOSIS — Z961 Presence of intraocular lens: Secondary | ICD-10-CM | POA: Diagnosis not present

## 2018-12-03 DIAGNOSIS — D485 Neoplasm of uncertain behavior of skin: Secondary | ICD-10-CM | POA: Diagnosis not present

## 2018-12-03 DIAGNOSIS — L821 Other seborrheic keratosis: Secondary | ICD-10-CM | POA: Diagnosis not present

## 2018-12-03 DIAGNOSIS — D225 Melanocytic nevi of trunk: Secondary | ICD-10-CM | POA: Diagnosis not present

## 2018-12-03 DIAGNOSIS — D1801 Hemangioma of skin and subcutaneous tissue: Secondary | ICD-10-CM | POA: Diagnosis not present

## 2018-12-03 DIAGNOSIS — D0472 Carcinoma in situ of skin of left lower limb, including hip: Secondary | ICD-10-CM | POA: Diagnosis not present

## 2018-12-03 DIAGNOSIS — Z85828 Personal history of other malignant neoplasm of skin: Secondary | ICD-10-CM | POA: Diagnosis not present

## 2018-12-03 DIAGNOSIS — L570 Actinic keratosis: Secondary | ICD-10-CM | POA: Diagnosis not present

## 2019-02-06 ENCOUNTER — Ambulatory Visit: Payer: Medicare Other | Attending: Internal Medicine

## 2019-02-06 DIAGNOSIS — Z23 Encounter for immunization: Secondary | ICD-10-CM | POA: Diagnosis not present

## 2019-02-06 NOTE — Progress Notes (Signed)
   Covid-19 Vaccination Clinic  Name:  Jamie Galloway    MRN: [id number] DOB: [birth year]  02/06/2019  [name] was observed post Covid-19 immunization for 15 minutes without incidence. She was provided with Vaccine Information Sheet and instruction to access the V-Safe system.   [name] was instructed to call 911 with any severe reactions post vaccine: Marland Kitchen Difficulty breathing  . Swelling of your face and throat  . A fast heartbeat  . A bad rash all over your body  . Dizziness and weakness    Immunizations Administered    Name Date Dose VIS Date Route   Pfizer COVID-19 Vaccine 02/06/2019  6:47 PM 0.3 mL 12/28/2018 Intramuscular   Manufacturer: [place]   Lot: [id number]   [place]: [id number]

## 2019-02-27 ENCOUNTER — Ambulatory Visit: Payer: Medicare Other | Attending: Internal Medicine

## 2019-02-27 DIAGNOSIS — Z23 Encounter for immunization: Secondary | ICD-10-CM | POA: Insufficient documentation

## 2019-02-27 NOTE — Progress Notes (Signed)
   Covid-19 Vaccination Clinic  Name:  Jamie Galloway    MRN: [id number] DOB: 02/17/1940  02/27/2019  [name] was observed post Covid-19 immunization for 15 minutes without incidence. She was provided with Vaccine Information Sheet and instruction to access the V-Safe system.   [name] was instructed to call 911 with any severe reactions post vaccine: Marland Kitchen Difficulty breathing  . Swelling of your face and throat  . A fast heartbeat  . A bad rash all over your body  . Dizziness and weakness    Immunizations Administered    Name Date Dose VIS Date Route   Pfizer COVID-19 Vaccine 02/27/2019 10:48 AM 0.3 mL 12/28/2018 Intramuscular   Manufacturer: [employer]   Lot: [id number]   [place]: [id number]

## 2019-03-12 ENCOUNTER — Ambulatory Visit: Payer: Medicare Other

## 2019-10-08 DIAGNOSIS — E7800 Pure hypercholesterolemia, unspecified: Secondary | ICD-10-CM | POA: Diagnosis not present

## 2019-10-08 DIAGNOSIS — Z23 Encounter for immunization: Secondary | ICD-10-CM | POA: Diagnosis not present

## 2019-11-18 DIAGNOSIS — Z961 Presence of intraocular lens: Secondary | ICD-10-CM | POA: Diagnosis not present

## 2019-11-18 DIAGNOSIS — H43813 Vitreous degeneration, bilateral: Secondary | ICD-10-CM | POA: Diagnosis not present

## 2019-11-18 DIAGNOSIS — H35433 Paving stone degeneration of retina, bilateral: Secondary | ICD-10-CM | POA: Diagnosis not present

## 2019-11-18 DIAGNOSIS — H5211 Myopia, right eye: Secondary | ICD-10-CM | POA: Diagnosis not present

## 2019-12-03 DIAGNOSIS — L570 Actinic keratosis: Secondary | ICD-10-CM | POA: Diagnosis not present

## 2019-12-03 DIAGNOSIS — Z85828 Personal history of other malignant neoplasm of skin: Secondary | ICD-10-CM | POA: Diagnosis not present

## 2019-12-03 DIAGNOSIS — L814 Other melanin hyperpigmentation: Secondary | ICD-10-CM | POA: Diagnosis not present

## 2019-12-03 DIAGNOSIS — L565 Disseminated superficial actinic porokeratosis (DSAP): Secondary | ICD-10-CM | POA: Diagnosis not present

## 2019-12-03 DIAGNOSIS — D225 Melanocytic nevi of trunk: Secondary | ICD-10-CM | POA: Diagnosis not present

## 2019-12-03 DIAGNOSIS — D2271 Melanocytic nevi of right lower limb, including hip: Secondary | ICD-10-CM | POA: Diagnosis not present

## 2019-12-03 DIAGNOSIS — L821 Other seborrheic keratosis: Secondary | ICD-10-CM | POA: Diagnosis not present

## 2019-12-03 DIAGNOSIS — D1801 Hemangioma of skin and subcutaneous tissue: Secondary | ICD-10-CM | POA: Diagnosis not present

## 2019-12-03 DIAGNOSIS — D2272 Melanocytic nevi of left lower limb, including hip: Secondary | ICD-10-CM | POA: Diagnosis not present

## 2020-02-13 DIAGNOSIS — N3001 Acute cystitis with hematuria: Secondary | ICD-10-CM | POA: Diagnosis not present

## 2020-05-26 DIAGNOSIS — E7800 Pure hypercholesterolemia, unspecified: Secondary | ICD-10-CM | POA: Diagnosis not present

## 2020-05-26 DIAGNOSIS — M8589 Other specified disorders of bone density and structure, multiple sites: Secondary | ICD-10-CM | POA: Diagnosis not present

## 2020-05-26 DIAGNOSIS — Z0000 Encounter for general adult medical examination without abnormal findings: Secondary | ICD-10-CM | POA: Diagnosis not present

## 2020-05-29 ENCOUNTER — Other Ambulatory Visit: Payer: Self-pay | Admitting: Family Medicine

## 2020-05-29 DIAGNOSIS — M810 Age-related osteoporosis without current pathological fracture: Secondary | ICD-10-CM

## 2020-08-21 ENCOUNTER — Other Ambulatory Visit: Payer: Self-pay | Admitting: Family Medicine

## 2020-08-21 DIAGNOSIS — M810 Age-related osteoporosis without current pathological fracture: Secondary | ICD-10-CM

## 2020-10-16 ENCOUNTER — Other Ambulatory Visit: Payer: Self-pay

## 2020-10-16 ENCOUNTER — Ambulatory Visit
Admission: RE | Admit: 2020-10-16 | Discharge: 2020-10-16 | Disposition: A | Discharge status: Home / Self Care | Payer: Medicare Other | Source: Ambulatory Visit | Attending: Family Medicine | Admitting: Family Medicine

## 2020-10-16 DIAGNOSIS — M810 Age-related osteoporosis without current pathological fracture: Secondary | ICD-10-CM

## 2020-10-16 DIAGNOSIS — Z1231 Encounter for screening mammogram for malignant neoplasm of breast: Secondary | ICD-10-CM | POA: Diagnosis not present

## 2020-10-16 DIAGNOSIS — Z23 Encounter for immunization: Secondary | ICD-10-CM | POA: Diagnosis not present

## 2020-11-20 DIAGNOSIS — H04221 Epiphora due to insufficient drainage, right lacrimal gland: Secondary | ICD-10-CM | POA: Diagnosis not present

## 2020-11-20 DIAGNOSIS — Z961 Presence of intraocular lens: Secondary | ICD-10-CM | POA: Diagnosis not present

## 2020-11-20 DIAGNOSIS — H524 Presbyopia: Secondary | ICD-10-CM | POA: Diagnosis not present

## 2020-11-26 DIAGNOSIS — L719 Rosacea, unspecified: Secondary | ICD-10-CM | POA: Diagnosis not present

## 2020-11-26 DIAGNOSIS — H04553 Acquired stenosis of bilateral nasolacrimal duct: Secondary | ICD-10-CM | POA: Diagnosis not present

## 2020-11-26 DIAGNOSIS — H04551 Acquired stenosis of right nasolacrimal duct: Secondary | ICD-10-CM | POA: Diagnosis not present

## 2020-11-26 DIAGNOSIS — H04552 Acquired stenosis of left nasolacrimal duct: Secondary | ICD-10-CM | POA: Diagnosis not present

## 2020-11-26 DIAGNOSIS — H04222 Epiphora due to insufficient drainage, left lacrimal gland: Secondary | ICD-10-CM | POA: Diagnosis not present

## 2020-11-26 DIAGNOSIS — J342 Deviated nasal septum: Secondary | ICD-10-CM | POA: Diagnosis not present

## 2020-12-03 DIAGNOSIS — L821 Other seborrheic keratosis: Secondary | ICD-10-CM | POA: Diagnosis not present

## 2020-12-03 DIAGNOSIS — D0471 Carcinoma in situ of skin of right lower limb, including hip: Secondary | ICD-10-CM | POA: Diagnosis not present

## 2020-12-03 DIAGNOSIS — L570 Actinic keratosis: Secondary | ICD-10-CM | POA: Diagnosis not present

## 2020-12-03 DIAGNOSIS — D1801 Hemangioma of skin and subcutaneous tissue: Secondary | ICD-10-CM | POA: Diagnosis not present

## 2020-12-03 DIAGNOSIS — D692 Other nonthrombocytopenic purpura: Secondary | ICD-10-CM | POA: Diagnosis not present

## 2020-12-03 DIAGNOSIS — Z85828 Personal history of other malignant neoplasm of skin: Secondary | ICD-10-CM | POA: Diagnosis not present

## 2020-12-03 DIAGNOSIS — D0461 Carcinoma in situ of skin of right upper limb, including shoulder: Secondary | ICD-10-CM | POA: Diagnosis not present

## 2020-12-08 DIAGNOSIS — E7800 Pure hypercholesterolemia, unspecified: Secondary | ICD-10-CM | POA: Diagnosis not present

## 2020-12-23 ENCOUNTER — Other Ambulatory Visit: Payer: Self-pay

## 2020-12-23 DIAGNOSIS — J342 Deviated nasal septum: Secondary | ICD-10-CM | POA: Diagnosis not present

## 2020-12-23 DIAGNOSIS — H04552 Acquired stenosis of left nasolacrimal duct: Secondary | ICD-10-CM | POA: Diagnosis not present

## 2020-12-23 DIAGNOSIS — H04412 Chronic dacryocystitis of left lacrimal passage: Secondary | ICD-10-CM | POA: Diagnosis not present

## 2020-12-23 DIAGNOSIS — H04222 Epiphora due to insufficient drainage, left lacrimal gland: Secondary | ICD-10-CM | POA: Diagnosis not present

## 2020-12-23 DIAGNOSIS — H04551 Acquired stenosis of right nasolacrimal duct: Secondary | ICD-10-CM | POA: Diagnosis not present

## 2020-12-23 DIAGNOSIS — H04553 Acquired stenosis of bilateral nasolacrimal duct: Secondary | ICD-10-CM | POA: Diagnosis not present

## 2021-02-15 ENCOUNTER — Ambulatory Visit
Admission: RE | Admit: 2021-02-15 | Discharge: 2021-02-15 | Disposition: A | Discharge status: Home / Self Care | Payer: Medicare Other | Source: Ambulatory Visit | Attending: Family Medicine | Admitting: Family Medicine

## 2021-02-15 DIAGNOSIS — M8589 Other specified disorders of bone density and structure, multiple sites: Secondary | ICD-10-CM | POA: Diagnosis not present

## 2021-02-15 DIAGNOSIS — M810 Age-related osteoporosis without current pathological fracture: Secondary | ICD-10-CM

## 2021-02-15 DIAGNOSIS — Z780 Asymptomatic menopausal state: Secondary | ICD-10-CM | POA: Diagnosis not present

## 2021-04-08 DIAGNOSIS — H04552 Acquired stenosis of left nasolacrimal duct: Secondary | ICD-10-CM | POA: Diagnosis not present

## 2021-04-08 DIAGNOSIS — H04222 Epiphora due to insufficient drainage, left lacrimal gland: Secondary | ICD-10-CM | POA: Diagnosis not present

## 2021-04-08 DIAGNOSIS — L719 Rosacea, unspecified: Secondary | ICD-10-CM | POA: Diagnosis not present

## 2021-04-08 DIAGNOSIS — L905 Scar conditions and fibrosis of skin: Secondary | ICD-10-CM | POA: Diagnosis not present

## 2021-06-09 DIAGNOSIS — M810 Age-related osteoporosis without current pathological fracture: Secondary | ICD-10-CM | POA: Diagnosis not present

## 2021-06-09 DIAGNOSIS — E7800 Pure hypercholesterolemia, unspecified: Secondary | ICD-10-CM | POA: Diagnosis not present

## 2021-06-09 DIAGNOSIS — Z0000 Encounter for general adult medical examination without abnormal findings: Secondary | ICD-10-CM | POA: Diagnosis not present

## 2021-10-19 ENCOUNTER — Other Ambulatory Visit (HOSPITAL_BASED_OUTPATIENT_CLINIC_OR_DEPARTMENT_OTHER): Payer: Self-pay

## 2021-10-19 DIAGNOSIS — Z23 Encounter for immunization: Secondary | ICD-10-CM | POA: Diagnosis not present

## 2021-10-19 MED ORDER — INFLUENZA VAC A&B SA ADJ QUAD 0.5 ML IM PRSY
PREFILLED_SYRINGE | INTRAMUSCULAR | 0 refills | Status: AC
  Filled 2021-10-19: qty 0.5, 1d supply, fill #0

## 2021-11-29 DIAGNOSIS — Z961 Presence of intraocular lens: Secondary | ICD-10-CM | POA: Diagnosis not present

## 2021-11-29 DIAGNOSIS — H524 Presbyopia: Secondary | ICD-10-CM | POA: Diagnosis not present

## 2021-12-16 DIAGNOSIS — L821 Other seborrheic keratosis: Secondary | ICD-10-CM | POA: Diagnosis not present

## 2021-12-16 DIAGNOSIS — Z85828 Personal history of other malignant neoplasm of skin: Secondary | ICD-10-CM | POA: Diagnosis not present

## 2021-12-16 DIAGNOSIS — L720 Epidermal cyst: Secondary | ICD-10-CM | POA: Diagnosis not present

## 2021-12-16 DIAGNOSIS — L565 Disseminated superficial actinic porokeratosis (DSAP): Secondary | ICD-10-CM | POA: Diagnosis not present

## 2021-12-16 DIAGNOSIS — L570 Actinic keratosis: Secondary | ICD-10-CM | POA: Diagnosis not present

## 2021-12-16 DIAGNOSIS — D225 Melanocytic nevi of trunk: Secondary | ICD-10-CM | POA: Diagnosis not present

## 2022-03-08 DIAGNOSIS — R300 Dysuria: Secondary | ICD-10-CM | POA: Diagnosis not present

## 2022-03-08 DIAGNOSIS — R000 Tachycardia, unspecified: Secondary | ICD-10-CM | POA: Diagnosis not present

## 2022-03-08 DIAGNOSIS — R82998 Other abnormal findings in urine: Secondary | ICD-10-CM | POA: Diagnosis not present

## 2022-03-08 DIAGNOSIS — N390 Urinary tract infection, site not specified: Secondary | ICD-10-CM | POA: Diagnosis not present

## 2022-03-11 DIAGNOSIS — D72829 Elevated white blood cell count, unspecified: Secondary | ICD-10-CM | POA: Diagnosis not present

## 2022-06-16 DIAGNOSIS — Z0000 Encounter for general adult medical examination without abnormal findings: Secondary | ICD-10-CM | POA: Diagnosis not present

## 2022-06-16 DIAGNOSIS — Z6829 Body mass index (BMI) 29.0-29.9, adult: Secondary | ICD-10-CM | POA: Diagnosis not present

## 2022-06-16 DIAGNOSIS — E7800 Pure hypercholesterolemia, unspecified: Secondary | ICD-10-CM | POA: Diagnosis not present

## 2022-10-04 ENCOUNTER — Other Ambulatory Visit (HOSPITAL_BASED_OUTPATIENT_CLINIC_OR_DEPARTMENT_OTHER): Payer: Self-pay

## 2022-10-04 DIAGNOSIS — Z23 Encounter for immunization: Secondary | ICD-10-CM | POA: Diagnosis not present

## 2022-10-04 MED ORDER — INFLUENZA VAC A&B SURF ANT ADJ 0.5 ML IM SUSY
0.5000 mL | PREFILLED_SYRINGE | Freq: Once | INTRAMUSCULAR | 0 refills | Status: AC
  Filled 2022-10-04 (×2): qty 0.5, 1d supply, fill #0

## 2022-10-05 ENCOUNTER — Other Ambulatory Visit (HOSPITAL_BASED_OUTPATIENT_CLINIC_OR_DEPARTMENT_OTHER): Payer: Self-pay

## 2022-12-08 DIAGNOSIS — Z961 Presence of intraocular lens: Secondary | ICD-10-CM | POA: Diagnosis not present

## 2022-12-08 DIAGNOSIS — H524 Presbyopia: Secondary | ICD-10-CM | POA: Diagnosis not present

## 2022-12-19 DIAGNOSIS — D225 Melanocytic nevi of trunk: Secondary | ICD-10-CM | POA: Diagnosis not present

## 2022-12-19 DIAGNOSIS — D692 Other nonthrombocytopenic purpura: Secondary | ICD-10-CM | POA: Diagnosis not present

## 2022-12-19 DIAGNOSIS — L821 Other seborrheic keratosis: Secondary | ICD-10-CM | POA: Diagnosis not present

## 2022-12-19 DIAGNOSIS — Z85828 Personal history of other malignant neoplasm of skin: Secondary | ICD-10-CM | POA: Diagnosis not present

## 2022-12-19 DIAGNOSIS — L570 Actinic keratosis: Secondary | ICD-10-CM | POA: Diagnosis not present

## 2023-06-08 ENCOUNTER — Other Ambulatory Visit: Payer: Self-pay | Admitting: Family Medicine

## 2023-06-08 DIAGNOSIS — Z1231 Encounter for screening mammogram for malignant neoplasm of breast: Secondary | ICD-10-CM

## 2023-06-16 ENCOUNTER — Ambulatory Visit
Admission: RE | Admit: 2023-06-16 | Discharge: 2023-06-16 | Disposition: A | Discharge status: Home / Self Care | Source: Ambulatory Visit | Attending: Family Medicine | Admitting: Family Medicine

## 2023-06-16 DIAGNOSIS — Z1231 Encounter for screening mammogram for malignant neoplasm of breast: Secondary | ICD-10-CM

## 2023-11-20 ENCOUNTER — Other Ambulatory Visit (HOSPITAL_BASED_OUTPATIENT_CLINIC_OR_DEPARTMENT_OTHER): Payer: Self-pay

## 2023-11-20 MED ORDER — FLUZONE HIGH-DOSE 0.5 ML IM SUSY
0.5000 mL | PREFILLED_SYRINGE | Freq: Once | INTRAMUSCULAR | 0 refills | Status: AC
  Filled 2023-11-20: qty 0.5, 1d supply, fill #0
